# Patient Record
Sex: Female | Born: 1955 | Race: White | Hispanic: No | Marital: Married | Smoking: Former smoker
Health system: Southern US, Community
[De-identification: ages and names within clinical notes are randomized; demographics above are authoritative.]

## PROBLEM LIST (undated history)

## (undated) DIAGNOSIS — E669 Obesity, unspecified: Secondary | ICD-10-CM

## (undated) DIAGNOSIS — F419 Anxiety disorder, unspecified: Secondary | ICD-10-CM

## (undated) DIAGNOSIS — I10 Essential (primary) hypertension: Secondary | ICD-10-CM

## (undated) HISTORY — PX: ABDOMINAL HYSTERECTOMY: SHX81

## (undated) HISTORY — PX: GASTRIC BYPASS: SHX52

## (undated) HISTORY — DX: Anxiety disorder, unspecified: F41.9

## (undated) HISTORY — PX: CATARACT EXTRACTION: SUR2

## (undated) HISTORY — PX: CHOLECYSTECTOMY: SHX55

---

## 2001-06-13 ENCOUNTER — Encounter (INDEPENDENT_AMBULATORY_CARE_PROVIDER_SITE_OTHER): Payer: Self-pay | Admitting: Specialist

## 2001-06-13 ENCOUNTER — Other Ambulatory Visit: Admission: RE | Admit: 2001-06-13 | Discharge: 2001-06-13 | Payer: Self-pay | Admitting: *Deleted

## 2001-11-08 ENCOUNTER — Inpatient Hospital Stay (HOSPITAL_COMMUNITY): Admission: RE | Admit: 2001-11-08 | Discharge: 2001-11-11 | Payer: Self-pay | Admitting: *Deleted

## 2001-11-08 ENCOUNTER — Encounter (INDEPENDENT_AMBULATORY_CARE_PROVIDER_SITE_OTHER): Payer: Self-pay

## 2003-04-17 ENCOUNTER — Ambulatory Visit (HOSPITAL_COMMUNITY): Admission: RE | Admit: 2003-04-17 | Discharge: 2003-04-17 | Payer: Self-pay | Admitting: Family Medicine

## 2003-04-17 ENCOUNTER — Encounter: Payer: Self-pay | Admitting: Family Medicine

## 2007-03-25 ENCOUNTER — Ambulatory Visit (HOSPITAL_BASED_OUTPATIENT_CLINIC_OR_DEPARTMENT_OTHER): Admission: RE | Admit: 2007-03-25 | Discharge: 2007-03-25 | Payer: Self-pay | Admitting: *Deleted

## 2007-03-27 ENCOUNTER — Ambulatory Visit: Payer: Self-pay | Admitting: Internal Medicine

## 2009-04-29 ENCOUNTER — Ambulatory Visit (HOSPITAL_COMMUNITY): Admission: RE | Admit: 2009-04-29 | Discharge: 2009-04-29 | Payer: Self-pay | Admitting: Surgery

## 2009-05-16 ENCOUNTER — Encounter: Admission: RE | Admit: 2009-05-16 | Discharge: 2009-05-16 | Payer: Self-pay | Admitting: Surgery

## 2009-05-31 ENCOUNTER — Ambulatory Visit (HOSPITAL_COMMUNITY): Admission: RE | Admit: 2009-05-31 | Discharge: 2009-05-31 | Payer: Self-pay | Admitting: Surgery

## 2011-05-08 NOTE — Op Note (Signed)
[facility]  Patient:    Anne Sims, Anne Sims Visit Number: [id number] MRN: [id number]          Service Type: GYN Location: 4W 0457 02 Attending Physician:  Marin Comment Dictated by:   Lindaann Slough, M.D. Proc. Date: 11/08/01 Admit Date:  11/08/2001   CC:         Pershing Cox, M.D.   Operative Report  PREOPERATIVE DIAGNOSIS:  Stress urinary incontinence.  POSTOPERATIVE DIAGNOSIS:  Stress urinary incontinence.  PROCEDURE:  Burch procedure.  SURGEON:  Lindaann Slough, M.D.  ASSISTANT:  Pershing Cox, M.D.  ANESTHESIA:  General.  INDICATION:  The patient is a 55 year old female, who has been complaining of stress incontinence for about 10 years.  Her incontinence is worse with coughing, sneezing, and laughter.  The symptoms have been getting worse lately.  She was found on physical examination to have a cystocele.  She is scheduled for a hysterectomy and for Burch procedure at the same time.  The hysterectomy was already performed by Dr. Carey Bullocks.  DESCRIPTION OF PROCEDURE:  The retropubic space was entered.  Then with a finger in the vagina, three sutures of 0 Prolene were placed on either side of the bladder neck.  The sutures were then approximated to the Coopers ligament.  Hemostasis was achieved with electrocautery.  The Foley catheter that was previously inserted in the bladder was removed.  A flexible cystoscope was then inserted in the bladder.  The bladder mucosa was normal.  There was no evidence of penetration of the bladder with the sutures. The ureteral orifices were in normal position and shape with clear efflux. The cystoscope was removed.  A #16 Foley catheter was then reinserted in the bladder.  Then the wound was closed by Dr. Carey Bullocks.  The patient tolerated the procedure well. Dictated by:   Lindaann Slough, M.D. Attending Physician:  Marin Comment DD:  11/08/01 TD:  11/08/01 Job:  [id number]

## 2011-05-08 NOTE — Discharge Summary (Signed)
[facility]  Patient:    RALYNN, SAN Visit Number: [id number] MRN: [id number]          Service Type: GYN Location: 4W 0457 02 Attending Physician:  Marin Comment Dictated by:   Pershing Cox, M.D. Admit Date:  11/08/2001 Discharge Date: 11/11/2001   CC:         Lindaann Slough, M.D.   Discharge Summary  ADMISSION DIAGNOSES: 1. Menorrhagia. 2. Uterine myomas. 3. Stress urinary incontinence.  DISCHARGE DIAGNOSES: 1. Menorrhagia. 2. Uterine myomas. 3. Stress urinary incontinence.  CONDITION ON DISCHARGE:  Stable.  For details of the patients admission history and physical, please see the transcribed note dated 11/08/01.  Briefly, this patient is a 55 year old woman who was found to have a submucosal myoma on evaluation of menorrhagia.  She also had significant stress urinary incontinence.  A repair procedure was planned, and the decision was made to proceed with hysterectomy in conjunction with this repair procedure.  HOSPITAL COURSE:  The patient was taken to the operating room on the day of admission, 11/08/01.  Under general anesthesia, exploratory laparotomy, total abdominal hysterectomy, and bilateral salpingo-oophorectomy were performed. Following this procedure, Dr. Brunilda Payor performed a Charletta Cousin procedure.  There were no intraoperative complications.  There was a total of 700 cc blood loss combining both procedures.  On the evening of postoperative day #1, the patient was without complaints except for pain from her Foley catheter.  She had an epidural placed for postoperative pain relief and was tolerating her pain well.  She was seen on postoperative day #1, and was not having any significant problems, and her epidural anesthesia was discontinued.  Dr. Brunilda Payor discontinued her Foley catheter on the morning of postoperative day #1.  On the morning of postoperative day #2, she was having no problems.  She was seen by Dr.  Brunilda Payor and felt that she was voiding without difficulty, and continued plans were made to follow her voiding.  On the morning of postoperative day #3, her pain was well controlled, and she was able to be discharged to home with prescriptions for Motrin and Tylox for pain.  She will be followed in Dr. Lemont Fillers office and in my office for postoperative care. Dictated by:   Pershing Cox, M.D. Attending Physician:  Marin Comment DD:  12/02/01 TD:  12/02/01 Job: [id number]

## 2011-05-08 NOTE — Procedures (Signed)
[name], Anne Sims              ACCOUNT NO.:  [id number]   MEDICAL RECORD NO.:  [id number]          PATIENT TYPE:  OUT   LOCATION:  SLEEP CENTER                 FACILITY:  [facility]   PHYSICIAN:  Clinton D. Maple Hudson, MD, FCCP, FACPDATE OF BIRTH:  07-25-55   DATE OF STUDY:  03/21/2007                            NOCTURNAL POLYSOMNOGRAM   REFERRING PHYSICIAN:   REFERRING PHYSICIAN:  Robert P. Merla Riches, M.D.   INDICATIONS FOR STUDY:  Insomnia with sleep apnea.  Epworth sleepiness  score 7/24, BMI 41.7, weight 213 pounds.  Home medications were listed  and reviewed.   SLEEP ARCHITECTURE:  Total sleep time 357 minutes with sleep efficiency  79%.  Stage I was 3%, stage II 72%, stages III and IV 2%, REM 23% of  total sleep time.  Sleep latency 23 minutes, REM latency 303 minutes.  Awake after sleep onset 70 minutes, arousal index 6.9.  No bedtime  medication was taken.   RESPIRATORY DATA:  Split study protocol.  Apnea/hypopnea index (AHI,  RDI) 54.6 obstructive events per hour, indicating severe obstructive  sleep apnea/hypopnea syndrome before CPAP.  There were 58 obstructive  apneas and 98 hypopneas before CPAP.  Events were not positional.  REM  AHI 9.6 per hour.  CPAP was titrated to 21 CWP to control snoring, AHI 0  per hour.  Complete obstructive event control had been obtained at 18  CWP, AHI 0 per hour.  A small ResMed Quattro mask was used with heated  humidifier.   OXYGEN DATA:  Moderate to loud snoring with oxygen desaturation to a  nadir of 87% before CPAP.  After CPAP control saturation held 95% on  room air.   CARDIAC DATA:  Normal sinus rhythm.   MOVEMENT/PARASOMNIA:  Occasional limb jerk, insignificant.   IMPRESSION/RECOMMENDATION:  1. Severe obstructive sleep apnea/hypopnea syndrome, AHI 54.6 per hour      with nonpositional events, moderate to loud snoring, and oxygen      desaturation to a nadir of 87%.  2. CPAP of 18 CWP was adequate to control obstructive  events.      Pressure of 21 CWP stopped all snoring but      would require a bilevel machine and the additional pressure may be      less well tolerated.  Suggest initial home trial at 18 CWP.  A      small ResMed Quattro mask was used with heated humidifier.      Clinton D. Maple Hudson, MD, [facility], FACP  Diplomate, Biomedical engineer of Sleep Medicine  Electronically Signed     CDY/MEDQ  D:  03/27/2007 12:58:12  T:  03/27/2007 23:06:00  Job:  [id number]

## 2011-05-08 NOTE — Op Note (Signed)
[facility]  Patient:    Anne Sims, JUMP Visit Number: [id number] MRN: [id number]          Service Type: Attending:  Pershing Cox, M.D. Dictated by:   Pershing Cox, M.D. Proc. Date: 11/08/01   CC:         Sung Amabile. Roslyn Smiling, M.D.  Lindaann Slough, M.D.   Operative Report  PREOPERATIVE DIAGNOSES: 1. Menorrhagia. 2. Myomatous uterus. 3. Morbid obesity.  POSTOPERATIVE DIAGNOSES: 1. Menorrhagia. 2. Myomatous uterus. 3. Morbid obesity.  PROCEDURE:  Exploratory laparotomy, total abdominal hysterectomy, bilateral salpingo-oophorectomy.  SURGEON:  Pershing Cox, M.D.  ASSISTANT SURGEON:  Sung Amabile. Roslyn Smiling, M.D.  ESTIMATED BLOOD LOSS:  200 cc (700 cc total when combined with the Burch procedure).  FLUIDS:  2800 crystalloid, 1000 of Hespan.  URINE OUTPUT:  275 cc.  INDICATIONS FOR PROCEDURE:  Anne Sims is a 55 year old female, who has been incapacitated by heavy vaginal bleeding.  She has no submucosal myomas but had a strong desire to move ahead with hysterectomy because of her bleeding.  For this reason, she is brought to operating room today for removal of her uterus.  She also has significant stress incontinence, and the repair of her bladder was accompanied by the hysterectomy to increase the chances of good response from her bladder repair.  See the notes from Dr. Brunilda Payor regarding this.  OPERATIVE FINDINGS:  The patients uterus is 14 weeks in size.  There were large fibroids, especially in the lower uterine segment and on the right, distorting the uterine blood supply.  Ovaries were normal in appearance. There was adhesive disease of the omentum to the lower abdomen and pelvis, especially attached to the left adnexa.  Otherwise, there were no significant findings.  There were adhesions of the omentum to the anterior abdominal wall over the side of the patients previous cholecystectomy scar.  DESCRIPTION OF PROCEDURE:   [name] was brought to the operating room with an IV in place.  She received 1 g of Ancef in the holding area.  Supine on the OR table, general endotracheal anesthesia was administered without difficulty.  She was placed into Allen stirrups where she remained for the remainder of the procedure.  Hibiclens was used to prep the anterior abdominal wall, upper thighs, perineum, and vagina.  A Foley catheter was sterilely inserted into the bladder.  The lower abdomen was marked appropriately for the patients incision.  Marcaine 0.25% was injected beneath the proposed incision line.  Scalpel was used to make the incision, carried down through the subcutaneous tissues.  Fascia was scored and opened with cautery and then curved Mayo scissors.  The fascia was separated from the underlying rectus muscles.  The rectus muscles were separated, and we entered the peritoneum atraumatically.  Exploratory laparotomy was performed.  Balfour retractor was used to retract the skin edges with the skin edges protected with wet drapes. Bladder blade was placed.  A long Kelly clamp was used to raise the uterus from the pelvis.  We were able to locate the round ligament on the left side which was suture ligated and then transected with cautery.  Because of the adhesive disease and the huge size of the uterus, we decided to leave the ovaries in situ until the hysterectomy had been completed.  A space in the broad ligament was pierced, and the uteroovarian ligament and fallopian tube were clamped, and the pedicle was cut and free-tie ligated.  Long Kelly clamp was kept on the uterine  adnexal attachment.  Next, the round ligament on the patients right was identified, suture ligated, and transected with cautery.  The right fallopian tube and ovary were separated from the uterus in a manner identical to the left.  The peritoneum overlying the lower uterine segment was incised, and the bladder was taken down by  sharp and blunt dissection off the lower uterine segment and upper cervix.  Starting on the patients left side, the uterine arteries were identified.  Because of the size of the uterus, it was necessary to place a straight clamp prior to getting to the uterine arteries.  This was suture ligated.  Then the Heaney clamp was used to clamp the uterine artery which was suture ligated.  A second throw was placed around the uterine artery so that it was doubly ligated.  A second straight clamp was placed on this side and Heaney ligated.  On the patients right side, a lower uterine segment fibroid distorted the uterine anatomy such that we had to dissect around this fibroid, placing straight clamps and sutures until we reached the bottom of the fibroid.  Uterine arteries on this side were secured with clamp, cut, suture, and a double pedicle like the left side.  Once we had taken both uterine arteries successfully, a malleable was placed behind the uterus, and a skin knife was used to incise the uterine fundus from the remaining cervix. With the fundus out of the way, we were then able to use straight clamps and come down the sides of the cervix with Heaney ligatures at each site.  We were able to discern uterosacral ligaments only by palpation.  These were clamped and Heaney ligated.  The cervix was then removed from the top of the vagina after Heaney clamps had been placed on either side.  Angled sutures were created and then tied to the uterosacral ligaments.  The sutures securing the vagina were run along the front and back of the cuff, and the cuff was then closed.  We inspected the pelvis for bleeding, and there was no evidence of active bleeding.  We then went above and removed the ovaries.  The peritoneum along the posterior broad ligament was incised so that the IP ligament could be completely visualized.  The ureters were below these pedicles and were at no  time in any danger.  IP  ligaments were clamped, cut, suture and free-tie ligated.  The peritoneal cavity was irrigated.  There was an adhesion of the omentum to the posterior cul-de-sac, and this was freed by sharp dissection.  The peritoneum was then closed with a running stitch of Vicryl.  Dr. Brunilda Payor then came into the operating room to perform the Burch procedure.  Please see his operative note for details of this repair.  Following Dr. Lemont Fillers completion of the procedure, the fascia was closed with 0 Monocryl running from side-to-side and tying in the middle.  The subcutaneous tissues were irrigated, and there was no evidence of active bleeding.  Skin staples were applied. Dictated by:   Pershing Cox, M.D. Attending:  Pershing Cox, M.D. DD:  11/08/01 TD:  11/08/01 Job: [id number]

## 2012-07-18 ENCOUNTER — Other Ambulatory Visit: Payer: Self-pay | Admitting: Family Medicine

## 2012-07-18 NOTE — Telephone Encounter (Signed)
Need chart

## 2012-07-19 NOTE — Telephone Encounter (Signed)
Chart pulled °

## 2012-08-16 ENCOUNTER — Ambulatory Visit: Payer: Self-pay | Admitting: Physician Assistant

## 2012-08-16 ENCOUNTER — Encounter: Payer: Self-pay | Admitting: Physician Assistant

## 2012-08-16 VITALS — BP 124/76 | HR 78 | Temp 98.1°F | Resp 16 | Ht 60.0 in | Wt 186.0 lb

## 2012-08-16 DIAGNOSIS — F419 Anxiety disorder, unspecified: Secondary | ICD-10-CM | POA: Insufficient documentation

## 2012-08-16 DIAGNOSIS — Z9884 Bariatric surgery status: Secondary | ICD-10-CM

## 2012-08-16 DIAGNOSIS — F411 Generalized anxiety disorder: Secondary | ICD-10-CM

## 2012-08-16 MED ORDER — CITALOPRAM HYDROBROMIDE 40 MG PO TABS: 40.0000 mg | ORAL_TABLET | Freq: Every day | ORAL | Status: DC

## 2012-08-16 NOTE — Progress Notes (Signed)
  Subjective:    Patient ID: Anne Sims, female    DOB: June 05, 1956, 56 y.o.   MRN: [id number]  HPI  Pt presents to clinic for medication refill.  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  She has noticed that she gets upset easier than normal.  She does know that stress induces her to increase eating which has added to her weight gain.  She has been on Wellbutrin in the past with good success, her sister is on buspar and she wonders if either of these might be good options for her.  Review of Systems  Psychiatric/Behavioral: The patient is nervous/anxious.        Objective:   Physical Exam  Vitals reviewed. Constitutional: She is oriented to person, place, and time. She appears well-developed and well-nourished.  HENT:  Head: Normocephalic and atraumatic.  Right Ear: External ear normal.  Left Ear: External ear normal.  Eyes: Conjunctivae are normal.  Neck: Normal range of motion.  Cardiovascular: Normal rate, regular rhythm and normal heart sounds.  Exam reveals no gallop and no friction rub.   No murmur heard. Pulmonary/Chest: Effort normal and breath sounds normal.  Neurological: She is alert and oriented to person, place, and time.  Skin: Skin is warm and dry.  Psychiatric: She has a normal mood and affect. Her behavior is normal. Judgment and thought content normal.        Assessment & Plan:   1. Anxiety  citalopram (CELEXA) 40 MG tablet  2. S/P gastric bypass     D/w pt the options - I believe an increase in her Celexa would be her best option.  She has no insurance and this will keep her payment the same and she knows she tolerates this medication well.  Pt will use this medication over the next 6-8 wks and if tolerating well will call  and we can call in 3 RF at that time.  If patient does not have improved symptoms she will RTC and we will add another medication at that time.

## 2012-09-21 ENCOUNTER — Ambulatory Visit: Payer: Self-pay | Admitting: Emergency Medicine

## 2012-09-21 VITALS — BP 125/76 | HR 80 | Temp 98.4°F | Resp 20 | Ht 60.5 in | Wt 184.0 lb

## 2012-09-21 DIAGNOSIS — L732 Hidradenitis suppurativa: Secondary | ICD-10-CM | POA: Insufficient documentation

## 2012-09-21 DIAGNOSIS — L02214 Cutaneous abscess of groin: Secondary | ICD-10-CM

## 2012-09-21 DIAGNOSIS — L03319 Cellulitis of trunk, unspecified: Secondary | ICD-10-CM

## 2012-09-21 MED ORDER — DOXYCYCLINE HYCLATE 100 MG PO CAPS: 100.0000 mg | ORAL_CAPSULE | Freq: Two times a day (BID) | ORAL | Status: DC

## 2012-09-21 NOTE — Progress Notes (Signed)
   Patient ID: JAYLENA HOLLOWAY MRN: [id number], DOB: 02/02/1956, 56 y.o. Date of Encounter: 09/21/2012, 4:19 PM    PROCEDURE NOTE: Verbal consent obtained. Betadine prep per usual protocol. Local anesthesia obtained with 1% plain lidocaine 4 cc.  T incision made with 11 blade along lesion to enlarge already draining two pores.  Culture taken already taken by Dr. Cleta Alberts. Moderate amount of purulence expressed. Dressed. Wound care instructions including precautions with patient. Patient tolerated the procedure well. Patient to perform warm compresses this evening. Recheck in 24 hours, to the ER if worsening.      Signed, Eula Listen, PA-C 09/21/2012 4:19 PM

## 2012-09-21 NOTE — Progress Notes (Signed)
  Subjective:    Patient ID: Anne Sims, female    DOB: 1956/01/01, 56 y.o.   MRN: [id number]  HPI Right inner thigh infection. Present for the last week getting worse with increasing purulent drainage without odor   Double anti-depressant increasing mood swings. Medication is now stabilized and moods are back to the way they were. The patient has a history of recurrent infections. She has a history of hidradenitis and has had recurrent disease.  Review of Systems     Objective:   Physical Exam She has signs of hidradenitis in the left groin. In the right groin she has evidence of previous I&D site as well as fistulous tracts. There is an open drainage site with purulent drainage coming from the fistulous tract with pressure superior to the fistulous tract material is expressed. Culture was obtained.       Assessment & Plan:  Go ahead and I&D the abscess site with doxycycline twice a day for I have asked Ryan to do an I&D with packing.

## 2012-09-22 ENCOUNTER — Ambulatory Visit (INDEPENDENT_AMBULATORY_CARE_PROVIDER_SITE_OTHER): Payer: Self-pay | Admitting: Physician Assistant

## 2012-09-22 VITALS — BP 126/76 | HR 67 | Temp 98.4°F | Resp 16 | Ht 60.5 in | Wt 184.0 lb

## 2012-09-22 DIAGNOSIS — L02214 Cutaneous abscess of groin: Secondary | ICD-10-CM

## 2012-09-22 DIAGNOSIS — L732 Hidradenitis suppurativa: Secondary | ICD-10-CM

## 2012-09-22 DIAGNOSIS — L03319 Cellulitis of trunk, unspecified: Secondary | ICD-10-CM

## 2012-09-22 NOTE — Progress Notes (Signed)
   Patient ID: ADDELYNN BAYES MRN: [id number], DOB: [birth year] 56 y.o. Date of Encounter: 09/22/2012, 11:14 AM  Primary Physician: Tally Due, MD  Chief Complaint: Wound care   See previous note  HPI: 56 y.o. y/o female presents for wound care s/p I&D on 09/21/12 Doing well No issues or complaints Feeling much better Afebrile/ no chills No nausea or vomiting Tolerating Doxycycline Pain much improved Daily dressing change Several hot compresses the previous evening Previous note reviewed  No past medical history on file.   Home Meds: Prior to Admission medications   Medication Sig Start Date End Date Taking? Authorizing Provider  citalopram (CELEXA) 40 MG tablet Take 1 tablet (40 mg total) by mouth daily. 08/16/12  Yes Morrell Riddle, PA-C  doxycycline (VIBRAMYCIN) 100 MG capsule Take 1 capsule (100 mg total) by mouth 2 (two) times daily. 09/21/12  Yes Collene Gobble, MD    Allergies: No Known Allergies  ROS: Constitutional: Afebrile, no chills Cardiovascular: negative for chest pain or palpitations Dermatological: Positive for wound. GI: No nausea or vomiting   EXAM: Physical Exam: Blood pressure 126/76, pulse 67, temperature 98.4 F (36.9 C), temperature source Oral, resp. rate 16, height 5' 0.5" (1.537 m), weight 184 lb (83.462 kg), last menstrual period 01/16/2001, SpO2 96.00%., Body mass index is 35.34 kg/(m^2). General: Well developed, well nourished, in no acute distress. Nontoxic appearing. Head: Normocephalic, atraumatic, sclera non-icteric.  Neck: Supple. Lungs: Breathing is unlabored. Heart: Normal rate. Skin:  Warm and moist. Dressing and packing in place. Improved induration and erythema. Minimal tenderness to palpation. Neuro: Alert and oriented X 3. Moves all extremities spontaneously. Normal gait.  Psych:  Responds to questions appropriately with a normal affect.   PROCEDURE: Dressing removed. Small amount of purulence expressed Wound bed  healthy Irrigated with 1% plain lidocaine 10 cc. Packed with 1/4 inch plain packing Dressing applied  LAB: Culture: pending  A/P: 56 y.o. y/o female with improving cellulitis/abscess as above s/p I&D on 09/21/12 -Wound care per above -Continue Doxycycline -Pain well controlled -Daily dressing changes -Recheck 24 hours  Signed, Eula Listen, PA-C 09/22/2012 11:14 AM

## 2012-09-23 ENCOUNTER — Ambulatory Visit (INDEPENDENT_AMBULATORY_CARE_PROVIDER_SITE_OTHER): Payer: Self-pay | Admitting: Physician Assistant

## 2012-09-23 VITALS — BP 126/76 | HR 74 | Temp 98.4°F | Resp 18 | Ht 60.5 in | Wt 184.0 lb

## 2012-09-23 DIAGNOSIS — L02419 Cutaneous abscess of limb, unspecified: Secondary | ICD-10-CM

## 2012-09-23 DIAGNOSIS — L732 Hidradenitis suppurativa: Secondary | ICD-10-CM

## 2012-09-23 NOTE — Progress Notes (Signed)
   Patient ID: Anne Sims MRN: [id number], DOB: Nov 19, 1956 56 y.o. Date of Encounter: 09/23/2012, 11:26 AM  Primary Physician: Tally Due, MD  Chief Complaint: Wound care   See previous note  HPI: 56 y.o. y/o female presents for wound care s/p I&D on 09/21/2012. Doing well No issues or complaints Afebrile/ no chills No nausea or vomiting Tolerating Doxycycline Pain mostly resolved Daily dressing change Previous note reviewed  No past medical history on file.   Home Meds: Prior to Admission medications   Medication Sig Start Date End Date Taking? Authorizing Provider  citalopram (CELEXA) 40 MG tablet Take 1 tablet (40 mg total) by mouth daily. 08/16/12  Yes Morrell Riddle, PA-C  doxycycline (VIBRAMYCIN) 100 MG capsule Take 1 capsule (100 mg total) by mouth 2 (two) times daily. 09/21/12  Yes Collene Gobble, MD    Allergies: No Known Allergies  ROS: Constitutional: Afebrile, no chills Cardiovascular: negative for chest pain or palpitations Dermatological: Positive for wound.  GI: No nausea or vomiting   EXAM: Physical Exam: Blood pressure 126/76, pulse 74, temperature 98.4 F (36.9 C), temperature source Oral, resp. rate 18, height 5' 0.5" (1.537 m), weight 184 lb (83.462 kg), last menstrual period 01/16/2001, SpO2 97.00%., Body mass index is 35.34 kg/(m^2). General: Well developed, well nourished, in no acute distress. Nontoxic appearing. Head: Normocephalic, atraumatic, sclera non-icteric.  Neck: Supple. Lungs: Breathing is unlabored. Heart: Normal rate. Skin:  Warm and moist. Dressing and packing in place. Much improved induration, erythema, or tenderness to palpation. Neuro: Alert and oriented X 3. Moves all extremities spontaneously. Normal gait.  Psych:  Responds to questions appropriately with a normal affect.   PROCEDURE: Dressing and packing removed. Minimal purulence expressed Wound bed healthy Irrigated with 1% plain lidocaine 5 cc. Repacked  with 1/4 inch plain packing Dressing applied  LAB: Culture: Preliminary No organisms seen  A/P: 56 y.o. y/o female with improving cellulitis/abscess as above s/p I&D on 09/21/2012 -Wound care per above -Continue Doxycyline -Pain well controlled -Daily dressing changes -Recheck 48-72 hours  Signed, Eula Listen, PA-C 09/23/2012 11:26 AM

## 2012-09-24 ENCOUNTER — Telehealth: Payer: Self-pay

## 2012-09-24 DIAGNOSIS — F419 Anxiety disorder, unspecified: Secondary | ICD-10-CM

## 2012-09-24 LAB — WOUND CULTURE: Gram Stain: NONE SEEN

## 2012-09-24 MED ORDER — CITALOPRAM HYDROBROMIDE 40 MG PO TABS: 40.0000 mg | ORAL_TABLET | Freq: Every day | ORAL | Status: DC

## 2012-09-24 MED ORDER — CIPROFLOXACIN HCL 500 MG PO TABS: 500.0000 mg | ORAL_TABLET | Freq: Two times a day (BID) | ORAL | Status: DC

## 2012-09-24 NOTE — Addendum Note (Signed)
Addended by: Johnnette Litter on: 09/24/2012 03:38 PM   Modules accepted: Orders

## 2012-09-24 NOTE — Telephone Encounter (Signed)
LMOM RX sent to pharmacy 

## 2012-09-24 NOTE — Telephone Encounter (Signed)
Pt states she is doing really well on Celexa 40. Says she knows she not going to run out until the end of Nov but wants to go ahead and get an ok from [place] to refill the med for the rest of the yr. Likes the 90d supply. [facility] on [address]

## 2012-09-24 NOTE — Telephone Encounter (Signed)
Rx sent to pharmacy   

## 2012-09-26 ENCOUNTER — Ambulatory Visit (INDEPENDENT_AMBULATORY_CARE_PROVIDER_SITE_OTHER): Payer: Self-pay | Admitting: Physician Assistant

## 2012-09-26 VITALS — BP 128/70 | HR 70 | Temp 98.1°F | Resp 16 | Ht 60.5 in | Wt 188.0 lb

## 2012-09-26 DIAGNOSIS — L03119 Cellulitis of unspecified part of limb: Secondary | ICD-10-CM

## 2012-09-26 DIAGNOSIS — L732 Hidradenitis suppurativa: Secondary | ICD-10-CM

## 2012-09-26 DIAGNOSIS — L02419 Cutaneous abscess of limb, unspecified: Secondary | ICD-10-CM

## 2012-09-26 NOTE — Progress Notes (Signed)
   Patient ID: Anne Sims MRN: [id number], DOB: [birth year] 56 y.o. Date of Encounter: 09/26/2012, 10:33 AM  Primary Physician: Tally Due, MD  Chief Complaint: Wound care   See previous note  HPI: 56 y.o. y/o female presents for wound care s/p I&D on 09/21/2012. Doing well No issues or complaints Afebrile/ no chills No nausea or vomiting Tolerating Ciprofloxacin  Pain improving Daily dressing change Culture came back as Doxycyline resistant Staph aureus, patient was changed to cipro on 09/25/12. She has taken three doses of her cipro at the time of this office visit. Previous note reviewed  No past medical history on file.   Home Meds: Prior to Admission medications   Medication Sig Start Date End Date Taking? Authorizing Provider  ciprofloxacin (CIPRO) 500 MG tablet Take 1 tablet (500 mg total) by mouth 2 (two) times daily. 09/24/12  Yes Collene Gobble, MD  citalopram (CELEXA) 40 MG tablet Take 1 tablet (40 mg total) by mouth daily. 09/24/12  Yes Heather M Marte, PA-C  doxycycline (VIBRAMYCIN) 100 MG capsule Take 1 capsule (100 mg total) by mouth 2 (two) times daily. 09/21/12  No Collene Gobble, MD    Allergies: No Known Allergies  ROS: Constitutional: Afebrile, no chills Cardiovascular: negative for chest pain or palpitations Dermatological: Positive for wound.  GI: No nausea or vomiting   EXAM: Physical Exam: Blood pressure 128/70, pulse 70, temperature 98.1 F (36.7 C), temperature source Oral, resp. rate 16, height 5' 0.5" (1.537 m), weight 188 lb (85.276 kg), last menstrual period 01/16/2001, SpO2 98.00%., Body mass index is 36.11 kg/(m^2). General: Well developed, well nourished, in no acute distress. Nontoxic appearing. Head: Normocephalic, atraumatic, sclera non-icteric.  Neck: Supple. Lungs: Breathing is unlabored. Heart: Normal rate. Skin:  Warm and moist. Dressing and packing in place. Mild induration with small amount of mild erythema spreading  distally, and tenderness to palpation inferiorly. Neuro: Alert and oriented X 3. Moves all extremities spontaneously. Normal gait.  Psych:  Responds to questions appropriately with a normal affect.   PROCEDURE: Dressing and packing removed. Small amount of purulence expressed from proximal aspect of lesion and inferior aspect of lesion. Wound bed healthy Irrigated with 1% plain lidocaine 5 cc. Repacked with 1/4 inch plain packing Dressing applied  LAB: Culture: Staph, resistant to Doxycycline  A/P: 56 y.o. y/o female with cellulitis/abscess as above s/p I&D on 09/21/2012. -Wound care per above -Continue Ciprofloxacin -Pain well controlled -Daily dressing changes -Recheck 48 hours, sooner if worse  Signed, Eula Listen, PA-C 09/26/2012 10:33 AM

## 2012-09-28 ENCOUNTER — Ambulatory Visit (INDEPENDENT_AMBULATORY_CARE_PROVIDER_SITE_OTHER): Payer: Self-pay | Admitting: Physician Assistant

## 2012-09-28 VITALS — BP 122/76 | HR 67 | Temp 97.7°F | Resp 16 | Ht 61.0 in | Wt 188.0 lb

## 2012-09-28 DIAGNOSIS — L02419 Cutaneous abscess of limb, unspecified: Secondary | ICD-10-CM

## 2012-09-28 DIAGNOSIS — L732 Hidradenitis suppurativa: Secondary | ICD-10-CM

## 2012-09-28 NOTE — Progress Notes (Signed)
   Patient ID: Anne Sims MRN: [id number], DOB: January 09, 1956 56 y.o. Date of Encounter: 09/28/2012, 10:04 AM  Primary Physician: Tally Due, MD  Chief Complaint: Wound care   See previous note  HPI: 56 y.o. y/o female presents for wound care s/p I&D on 09/21/2012. Doing well No issues or complaints Afebrile/ no chills No nausea or vomiting Tolerating Cipro well Pain resolved "I wanted to have sex this morning." Daily dressing change Erythema resolved Previous note reviewed  No past medical history on file.   Home Meds: Prior to Admission medications   Medication Sig Start Date End Date Taking? Authorizing Provider  ciprofloxacin (CIPRO) 500 MG tablet Take 1 tablet (500 mg total) by mouth 2 (two) times daily. 09/24/12  Yes Collene Gobble, MD  citalopram (CELEXA) 40 MG tablet Take 1 tablet (40 mg total) by mouth daily. 09/24/12  Yes Heather Jaquita Rector, PA-C    Allergies: No Known Allergies  ROS: Constitutional: Afebrile, no chills Cardiovascular: negative for chest pain or palpitations Dermatological: Positive for wound. Negative for erythema, pain, or warmth  GI: No nausea or vomiting   EXAM: Physical Exam: Blood pressure 122/76, pulse 67, temperature 97.7 F (36.5 C), temperature source Oral, resp. rate 16, height 5\' 1"  (1.549 m), weight 188 lb (85.276 kg), last menstrual period 01/16/2001, SpO2 98.00%., Body mass index is 35.52 kg/(m^2). General: Well developed, well nourished, in no acute distress. Nontoxic appearing. Head: Normocephalic, atraumatic, sclera non-icteric.  Neck: Supple. Lungs: Breathing is unlabored. Heart: Normal rate. Skin:  Warm and moist. Dressing and packing in place. No induration. Minimal erythema inferiorly, this is much improved from prior office visit. Slight deep tenderness to palpation although this too is much improved. Neuro: Alert and oriented X 3. Moves all extremities spontaneously. Normal gait.  Psych:  Responds to questions  appropriately with a normal affect.   PROCEDURE: Dressing and packing removed. Scant purulence expressed Wound bed healthy Irrigated with 1% plain lidocaine 5 cc. Repacked with 1/4 inch plain packing Dressing applied  LAB: Culture: Staph aureus resistant to Doxycycline, on Cipro  A/P: 56 y.o. y/o female with improving cellulitis/abscess as above s/p I&D on 09/21/2012.  -Wound care per above -Continue Cipro -Pain resolved -Daily dressing changes -Recheck 48 hours  Signed, Eula Listen, PA-C 09/28/2012 10:04 AM

## 2012-09-30 ENCOUNTER — Ambulatory Visit (INDEPENDENT_AMBULATORY_CARE_PROVIDER_SITE_OTHER): Payer: Self-pay | Admitting: Physician Assistant

## 2012-09-30 VITALS — BP 138/70 | HR 74 | Temp 98.1°F | Resp 18 | Ht 60.5 in | Wt 187.0 lb

## 2012-09-30 DIAGNOSIS — L02419 Cutaneous abscess of limb, unspecified: Secondary | ICD-10-CM

## 2012-09-30 DIAGNOSIS — L03119 Cellulitis of unspecified part of limb: Secondary | ICD-10-CM

## 2012-09-30 NOTE — Progress Notes (Signed)
Patient ID: ZOI DEVINE MRN: [id number], DOB: [birth year] 56 y.o. Date of Encounter: 09/30/2012, 11:23 AM  Chief Complaint: Wound care   See previous note  HPI: 56 y.o. y/o female presents for wound care s/p I&D on 09/21/12 Doing well No issues or complaints Afebrile/ no chills No nausea or vomiting Tolerating Cipro Pain stable. Daily dressing change Previous note reviewed  No past medical history on file.   Home Meds: Prior to Admission medications   Medication Sig Start Date End Date Taking? Authorizing Provider  ciprofloxacin (CIPRO) 500 MG tablet Take 1 tablet (500 mg total) by mouth 2 (two) times daily. 09/24/12  Yes Collene Gobble, MD  citalopram (CELEXA) 40 MG tablet Take 1 tablet (40 mg total) by mouth daily. 09/24/12  Yes Fareed Fung Jaquita Rector, PA-C    Allergies: No Known Allergies  ROS: Constitutional: Afebrile, no chills Cardiovascular: negative for chest pain or palpitations Dermatological: Positive for wound. Negative for erythema, pain, or warmth.  GI: No nausea or vomiting   EXAM: Physical Exam: Blood pressure 138/70, pulse 74, temperature 98.1 F (36.7 C), resp. rate 18, height 5' 0.5" (1.537 m), weight 187 lb (84.823 kg), last menstrual period 01/16/2001, SpO2 97.00%., Body mass index is 35.92 kg/(m^2). General: Well developed, well nourished, in no acute distress. Nontoxic appearing. Head: Normocephalic, atraumatic, sclera non-icteric.  Neck: Supple. Lungs: Breathing is unlabored. Heart: Normal rate. Skin:  Warm and moist. Dressing and packing in place. No induration, erythema, or tenderness to palpation. Neuro: Alert and oriented X 3. Moves all extremities spontaneously. Normal gait.  Psych:  Responds to questions appropriately with a normal affect.       PROCEDURE: Dressing and packing removed. No purulence expressed Wound bed healthy Irrigated with 1% plain lidocaine 5 cc. Repacked with small amount of 1/4" plain packing.  Dressing  applied  LAB: Culture:   A/P: 56 y.o. y/o female with groin cellulitis/abscess as above s/p I&D on Wound care per above Continue Cipro Pain well controlled Daily dressing changes Recheck 48 hours if needed.   Grier Mitts, PA-C 09/30/2012 11:23 AM

## 2012-11-08 ENCOUNTER — Telehealth: Payer: Self-pay

## 2012-11-08 DIAGNOSIS — F419 Anxiety disorder, unspecified: Secondary | ICD-10-CM

## 2012-11-08 NOTE — Telephone Encounter (Signed)
Pt would like to talk with Anne Sims and she would not go into detail

## 2012-11-09 NOTE — Telephone Encounter (Signed)
Can you try and get some information for me.

## 2012-11-10 MED ORDER — CITALOPRAM HYDROBROMIDE 40 MG PO TABS: 40.0000 mg | ORAL_TABLET | Freq: Every day | ORAL | Status: DC

## 2012-11-10 NOTE — Telephone Encounter (Signed)
Prescription sent to pharmacy.

## 2012-11-10 NOTE — Telephone Encounter (Signed)
Called patient, she has seen Maralyn Sago for Celexa. She states she is doing great on this medication, she needs renewal sent in to pharmacy. Have pended the Rx she is grateful to you for helping her she states she is "great"

## 2012-11-10 NOTE — Telephone Encounter (Signed)
Notified pt we have sent in Rx. Pt requests that a year of RFs be put on the Rx since she is doing so well and Maralyn Sago and Dr L have been good about getting her RFs for a year since she has no ins. Maralyn Sago, do you want to authorize 3 RFs on the 90 day Rx that was sent?

## 2012-11-11 MED ORDER — CITALOPRAM HYDROBROMIDE 40 MG PO TABS: 40.0000 mg | ORAL_TABLET | Freq: Every day | ORAL | Status: DC

## 2012-11-11 NOTE — Telephone Encounter (Signed)
I can refill until August and then I would need to see her to recheck unless something changes before then.

## 2012-11-11 NOTE — Telephone Encounter (Signed)
Notified pt that RFs sent in through Aug and then she needs OV. Pt thanked [place] and agreed.

## 2013-11-07 ENCOUNTER — Ambulatory Visit: Payer: Self-pay | Admitting: Physician Assistant

## 2013-11-07 VITALS — BP 120/80 | HR 60 | Temp 97.8°F | Resp 16 | Ht 60.0 in | Wt 194.0 lb

## 2013-11-07 DIAGNOSIS — E669 Obesity, unspecified: Secondary | ICD-10-CM

## 2013-11-07 DIAGNOSIS — F411 Generalized anxiety disorder: Secondary | ICD-10-CM

## 2013-11-07 DIAGNOSIS — F419 Anxiety disorder, unspecified: Secondary | ICD-10-CM

## 2013-11-07 MED ORDER — CITALOPRAM HYDROBROMIDE 40 MG PO TABS
40.0000 mg | ORAL_TABLET | Freq: Every day | ORAL | Status: DC
Start: 2013-11-07 — End: 2014-07-05

## 2013-11-07 NOTE — Progress Notes (Signed)
    Subjective:    Patient ID: Anne Sims, female    DOB: 09-21-1956, 56 y.o.   MRN: [id number]  HPI  Pt presents to clinic for med refill.  She feels like her Celexa is working good for her anxiety.  She recently has been under an increase amount of stress and has been a little more edgy than normal but she feels that it will pass and is happy with the Celexa results.  She is aware of her slight increase in weight. She is not sleeping well at night and she will eat when she wakes up at night and is sometimes not aware that she does it - some mornings her husbands lets her know she ate a bowl of ice cream in the middle of the night but she has no memory of it.  She is hoping to get insurance in the spring and at that time will get a referral for repeat sleep study because she has a h/o sleep apnea and used a CPAP prior to her gastric sleeve.  Review of Systems     Objective:   Physical Exam  Vitals reviewed. Constitutional: She is oriented to person, place, and time. She appears well-developed and well-nourished.  HENT:  Head: Normocephalic and atraumatic.  Right Ear: External ear normal.  Left Ear: External ear normal.  Cardiovascular: Normal rate, regular rhythm and normal heart sounds.   No murmur heard. Pulmonary/Chest: Effort normal and breath sounds normal.  Neurological: She is alert and oriented to person, place, and time.  Skin: Skin is warm and dry.  Psychiatric: She has a normal mood and affect. Her behavior is normal. Judgment and thought content normal.      Assessment & Plan:  Anxiety - Plan: citalopram (CELEXA) 40 MG tablet  Continue current medication - if she feels like she is having a decrease in effectiveness over the next year RTC for recheck.  She is aware and will try to improve her eating habits so she does not regain her weight - she states she "is just lazy".  Benny Lennert PA-C 11/07/2013 12:20 PM

## 2014-07-05 ENCOUNTER — Encounter (HOSPITAL_COMMUNITY): Payer: Self-pay | Admitting: Emergency Medicine

## 2014-07-05 ENCOUNTER — Emergency Department (HOSPITAL_COMMUNITY)
Admission: EM | Admit: 2014-07-05 | Discharge: 2014-07-05 | Disposition: A | Discharge status: Home / Self Care | Payer: Self-pay | Attending: Emergency Medicine | Admitting: Emergency Medicine

## 2014-07-05 DIAGNOSIS — Y9389 Activity, other specified: Secondary | ICD-10-CM | POA: Insufficient documentation

## 2014-07-05 DIAGNOSIS — Z87891 Personal history of nicotine dependence: Secondary | ICD-10-CM | POA: Insufficient documentation

## 2014-07-05 DIAGNOSIS — IMO0002 Reserved for concepts with insufficient information to code with codable children: Secondary | ICD-10-CM | POA: Insufficient documentation

## 2014-07-05 DIAGNOSIS — S1093XA Contusion of unspecified part of neck, initial encounter: Principal | ICD-10-CM

## 2014-07-05 DIAGNOSIS — I10 Essential (primary) hypertension: Secondary | ICD-10-CM | POA: Insufficient documentation

## 2014-07-05 DIAGNOSIS — Z79899 Other long term (current) drug therapy: Secondary | ICD-10-CM | POA: Insufficient documentation

## 2014-07-05 DIAGNOSIS — Z23 Encounter for immunization: Secondary | ICD-10-CM | POA: Insufficient documentation

## 2014-07-05 DIAGNOSIS — S0083XA Contusion of other part of head, initial encounter: Principal | ICD-10-CM | POA: Insufficient documentation

## 2014-07-05 DIAGNOSIS — S0003XA Contusion of scalp, initial encounter: Secondary | ICD-10-CM | POA: Insufficient documentation

## 2014-07-05 DIAGNOSIS — W540XXA Bitten by dog, initial encounter: Secondary | ICD-10-CM | POA: Insufficient documentation

## 2014-07-05 DIAGNOSIS — Y929 Unspecified place or not applicable: Secondary | ICD-10-CM | POA: Insufficient documentation

## 2014-07-05 HISTORY — DX: Essential (primary) hypertension: I10

## 2014-07-05 MED ORDER — TETANUS-DIPHTH-ACELL PERTUSSIS 5-2.5-18.5 LF-MCG/0.5 IM SUSP
0.5000 mL | Freq: Once | INTRAMUSCULAR | Status: AC
  Administered 2014-07-05: 0.5 mL via INTRAMUSCULAR
  Filled 2014-07-05: qty 0.5

## 2014-07-05 MED ORDER — AMOXICILLIN-POT CLAVULANATE 875-125 MG PO TABS: 1.0000 | ORAL_TABLET | Freq: Two times a day (BID) | ORAL | Status: DC

## 2014-07-05 NOTE — Discharge Instructions (Signed)
Return here as needed.  Followup with your primary care Dr. keep the areas clean and dry

## 2014-07-05 NOTE — ED Notes (Addendum)
Pt. bitten by neighbor's dog at right upper cheek and left index finger this evening , skin intact / minor abrasions , mild swelling .

## 2014-07-05 NOTE — ED Provider Notes (Signed)
CSN: [id number]     Arrival date & time 07/05/14  2024 History  This chart was scribed for Charlestine Nighthristopher Haydn Cush, PA-C working with Shon Batonourtney F Horton, MD by Evon Slackerrance Branch, ED Scribe. This patient was seen in room TR10C/TR10C and the patient's care was started at 8:50 PM.    Chief Complaint  Patient presents with  . Animal Bite   Patient is a 58 y.o. female presenting with animal bite. The history is provided by the patient. No language interpreter was used.  Animal Bite  HPI Comments: Anne Sims is a 58 y.o. female who presents to the Emergency Department complaining of dog bite onset prior to arrival. She states she was trying to break the two dogs up from fighting. She states she was bit on the hand, left side of face and lip. She states the dogs vaccinations are up to date. She states that her pain is 2/10. She denies any other symptoms.  She states she is unsure of her last tetanus.   Past Medical History  Diagnosis Date  . Hypertension    Past Surgical History  Procedure Laterality Date  . Cholecystectomy    . Abdominal hysterectomy     Family History  Problem Relation Age of Onset  . Cancer Father     esophageal  . Heart disease Father   . Hypertension Father   . High Cholesterol Father   . Heart disease Mother   . Healthy Sister    History  Substance Use Topics  . Smoking status: Former Smoker -- 37 years    Quit date: 06/20/2004  . Smokeless tobacco: Not on file  . Alcohol Use: No   OB History   Grav Para Term Preterm Abortions TAB SAB Ect Mult Living                 Review of Systems  Skin: Positive for wound.  A complete 10 system review of systems was obtained and all systems are negative except as noted in the HPI and PMH.    Allergies  Review of patient's allergies indicates no known allergies.  Home Medications   Prior to Admission medications   Medication Sig Start Date End Date Taking? Authorizing Provider  citalopram (CELEXA) 40 MG tablet  Take 1 tablet (40 mg total) by mouth daily. 11/07/13   Morrell RiddleSarah L Weber, PA-C   Triage Vitals: BP 139/73  Pulse 84  Temp(Src) 98.5 F (36.9 C) (Oral)  Resp 14  Ht 5' (1.524 m)  Wt 189 lb (85.73 kg)  BMI 36.91 kg/m2  SpO2 94%  LMP 01/16/2001  Physical Exam  Nursing note and vitals reviewed. Constitutional: She is oriented to person, place, and time. She appears well-developed and well-nourished. No distress.  HENT:  Head: Normocephalic.    Eyes: Pupils are equal, round, and reactive to light.  Neck: Neck supple. No tracheal deviation present.  Cardiovascular: Normal rate.   Pulmonary/Chest: Effort normal. No respiratory distress.  Musculoskeletal: Normal range of motion.       Hands: Neurological: She is alert and oriented to person, place, and time.  Skin: Skin is warm and dry.  Psychiatric: She has a normal mood and affect. Her behavior is normal.    ED Course  Procedures (including critical care time) DIAGNOSTIC STUDIES: Oxygen Saturation is 94% on RA, adequate by my interpretation.    COORDINATION OF CARE: 9:02 PM-Discussed treatment plan which includes antibiotics with pt at bedside and pt agreed to plan.   The patient,  states, that the dog that bit her has all the shots up-to-date.  These are more superficial, swollen areas rather than deep punctures I personally performed the services described in this documentation, which was scribed in my presence. The recorded information has been reviewed and is accurate.     Carlyle Dolly, PA-C 07/05/14 2131

## 2014-07-06 NOTE — ED Provider Notes (Signed)
Medical screening examination/treatment/procedure(s) were performed by non-physician practitioner and as supervising physician I was immediately available for consultation/collaboration.   EKG Interpretation None        Courtney F Horton, MD 07/06/14 0834 

## 2015-01-17 ENCOUNTER — Ambulatory Visit (INDEPENDENT_AMBULATORY_CARE_PROVIDER_SITE_OTHER): Payer: 59

## 2015-01-17 ENCOUNTER — Ambulatory Visit (INDEPENDENT_AMBULATORY_CARE_PROVIDER_SITE_OTHER): Payer: 59 | Admitting: Family Medicine

## 2015-01-17 VITALS — BP 142/72 | HR 71 | Temp 98.6°F | Resp 16 | Ht 60.0 in | Wt 197.4 lb

## 2015-01-17 DIAGNOSIS — IMO0001 Reserved for inherently not codable concepts without codable children: Secondary | ICD-10-CM

## 2015-01-17 DIAGNOSIS — R030 Elevated blood-pressure reading, without diagnosis of hypertension: Secondary | ICD-10-CM

## 2015-01-17 DIAGNOSIS — S4991XA Unspecified injury of right shoulder and upper arm, initial encounter: Secondary | ICD-10-CM

## 2015-01-17 DIAGNOSIS — J159 Unspecified bacterial pneumonia: Secondary | ICD-10-CM

## 2015-01-17 DIAGNOSIS — R0602 Shortness of breath: Secondary | ICD-10-CM

## 2015-01-17 LAB — COMPLETE METABOLIC PANEL WITH GFR
ALK PHOS: 129 U/L — AB (ref 39–117)
ALT: 22 U/L (ref 0–35)
AST: 22 U/L (ref 0–37)
Albumin: 4.1 g/dL (ref 3.5–5.2)
BUN: 20 mg/dL (ref 6–23)
CALCIUM: 9.2 mg/dL (ref 8.4–10.5)
CO2: 28 mEq/L (ref 19–32)
CREATININE: 0.73 mg/dL (ref 0.50–1.10)
Chloride: 99 mEq/L (ref 96–112)
GFR, Est Non African American: >89 mL/min
Glucose, Bld: 89 mg/dL (ref 70–99)
Potassium: 4.5 mEq/L (ref 3.5–5.3)
SODIUM: 138 meq/L (ref 135–145)
Total Bilirubin: 1.1 mg/dL (ref 0.2–1.2)
Total Protein: 7.6 g/dL (ref 6.0–8.3)

## 2015-01-17 LAB — POCT CBC
Granulocyte percent: 77.8 %G (ref 37–80)
HCT, POC: 41.5 % (ref 37.7–47.9)
HEMOGLOBIN: 13.3 g/dL (ref 12.2–16.2)
LYMPH, POC: 2 (ref 0.6–3.4)
MCH, POC: 28.1 pg (ref 27–31.2)
MCHC: 32.1 g/dL (ref 31.8–35.4)
MCV: 87.5 fL (ref 80–97)
MID (CBC): 0.5 (ref 0–0.9)
MPV: 7.5 fL (ref 0–99.8)
PLATELET COUNT, POC: 362 10*3/uL (ref 142–424)
POC GRANULOCYTE: 8.9 — AB (ref 2–6.9)
POC LYMPH %: 17.6 % (ref 10–50)
POC MID %: 4.6 % (ref 0–12)
RBC: 4.74 M/uL (ref 4.04–5.48)
RDW, POC: 13.8 %
WBC: 11.4 10*3/uL — AB (ref 4.6–10.2)

## 2015-01-17 LAB — TSH: TSH: 1.588 u[IU]/mL (ref 0.350–4.500)

## 2015-01-17 MED ORDER — AZITHROMYCIN 500 MG PO TABS: 500.0000 mg | ORAL_TABLET | Freq: Every day | ORAL | Status: DC

## 2015-01-17 MED ORDER — GUAIFENESIN ER 1200 MG PO TB12: 1.0000 | ORAL_TABLET | Freq: Two times a day (BID) | ORAL | Status: AC

## 2015-01-17 NOTE — Progress Notes (Signed)
Subjective:    Patient ID: Anne Sims, female    DOB: 19-May-1956, 59 y.o.   MRN: [id number]  HPI Pt presents to clinic with 2 concerns 1- SOB - started yesterday and feels like she cannot get a full breath in.  She has a slight cough when she takes a deep breath. No wheezing.  Cough is dry.  She has not had recent travel or leg pain or swelling.  She has no clotting disorder nor family h/o clotting disorders.  She had a cold about 1.5 weeks ago that caused her to stay home from work and that never happens but she felt really bad.  Currently the congestion and cold symptoms are much better. 2- would like her thyroid checked.  She has been exercising and eating mainly salads but it not losing weight and wants to make sure everything is ok.  3- fell this am on ice and landed on her right shoulder and it is really sore and she wants to make sure everything is ok.  She is having no elbow or wrist pain.  The pain seems more in the muscle but her bones do not hurt.  She has taken nothing for the pain.  Has tried to let all the negatives go and she is much better since she has started this.  Working out in gym since Oct 2016 (MWF am - at the gym with a trainer and MW night - water aerobics) - not losing weight but clothes are fitting looser  Review of Systems  Respiratory: Positive for shortness of breath.        No h/o asthma, nonsmoker (quit 10 year ago)  Musculoskeletal: Negative for joint swelling.      Objective:   Physical Exam  Constitutional: She is oriented to person, place, and time. She appears well-developed and well-nourished.  BP 142/72 mmHg  Pulse 71  Temp(Src) 98.6 F (37 C) (Oral)  Resp 16  Ht 5' (1.524 m)  Wt 197 lb 6.4 oz (89.54 kg)  BMI 38.55 kg/m2  SpO2 94%  LMP 01/16/2001   HENT:  Head: Normocephalic and atraumatic.  Right Ear: External ear normal.  Left Ear: External ear normal.  Cardiovascular: Normal rate, regular rhythm and normal heart sounds.   No  murmur heard. Pulmonary/Chest: Effort normal and breath sounds normal. She has no wheezes.  Musculoskeletal:       Right shoulder: She exhibits decreased range of motion (decrease abduction). She exhibits no tenderness, no bony tenderness and no spasm.       Left shoulder: Normal.  Pt has minimal ROM when she was 1st examined but towards the end of the visit she has significant improvement in her ROM of her shoulder.  Good cap refills.  NL ROM of elbow and wrist on the right side.  Neurological: She is alert and oriented to person, place, and time.  Skin: Skin is warm and dry.  Psychiatric: She has a normal mood and affect. Her behavior is normal. Judgment and thought content normal.   [facility] reading (PRIMARY) by  Dr. Neva Seat.  Retrocardiac infiltrate ? LLL PNA, nl shoulder.  Results for orders placed or performed in visit on 01/17/15  POCT CBC  Result Value Ref Range   WBC 11.4 (A) 4.6 - 10.2 K/uL   Lymph, poc 2.0 0.6 - 3.4   POC LYMPH PERCENT 17.6 10 - 50 %L   MID (cbc) 0.5 0 - 0.9   POC MID % 4.6 0 - 12 %  M   POC Granulocyte 8.9 (A) 2 - 6.9   Granulocyte percent 77.8 37 - 80 %G   RBC 4.74 4.04 - 5.48 M/uL   Hemoglobin 13.3 12.2 - 16.2 g/dL   HCT, POC 16.141.5 09.637.7 - 47.9 %   MCV 87.5 80 - 97 fL   MCH, POC 28.1 27 - 31.2 pg   MCHC 32.1 31.8 - 35.4 g/dL   RDW, POC 04.513.8 %   Platelet Count, POC 362 142 - 424 K/uL   MPV 7.5 0 - 99.8 fL       Assessment & Plan:  SOB (shortness of breath) - Wells criteria is 0 -- Plan: EKG 12-Lead, DG Chest 2 View, COMPLETE METABOLIC PANEL WITH GFR, POCT CBC  Right shoulder injury, initial encounter - seems to be improving as she gets further from the fall - she will f/u if she is having problems - there is concern for a rotator cuff injury but she is having to much pain with movement during the exam to do a full shoulder exam - Plan: DG Shoulder Right  Elevated BP - believe related to her fall - Plan: TSH  Bacterial pneumonia - Plan: azithromycin  (ZITHROMAX) 500 MG tablet, Guaifenesin (MUCINEX MAXIMUM STRENGTH) 1200 MG TB12  Benny LennertSarah Ayzia Day PA-C  [facility] 01/17/2015 6:50 PM

## 2015-01-17 NOTE — Patient Instructions (Signed)
I will contact you with your lab results as soon as they are available.   If you have not heard from me in 2 weeks, please contact me.  The fastest way to get your results is to register for My Chart (see the instructions on the last page of this printout).   

## 2015-01-19 NOTE — Progress Notes (Signed)
Xray read and patient discussed with Anne Sims. Agree with assessment and plan of care per her note.   

## 2015-01-25 ENCOUNTER — Telehealth: Payer: Self-pay

## 2015-01-25 NOTE — Telephone Encounter (Signed)
No further discussion needed.

## 2015-01-25 NOTE — Telephone Encounter (Signed)
Patient returned phone call about lab results. Lab personnel not available so I delivered Jethro PolingSarah Weber's message as follows: "Thyroid is normal. 1 of her liver enzymes is slightly elevated but it is nothing that she has to worry about because she feels fine." Patient states that she is feeling a little tired but she thinks that is from the pneumonia. Please call back if further discussion is needed.   [phone]

## 2015-05-31 ENCOUNTER — Encounter: Payer: Self-pay | Admitting: *Deleted

## 2015-10-28 ENCOUNTER — Ambulatory Visit (INDEPENDENT_AMBULATORY_CARE_PROVIDER_SITE_OTHER): Payer: Self-pay | Admitting: Emergency Medicine

## 2015-10-28 VITALS — BP 130/82 | HR 71 | Temp 98.2°F | Resp 18 | Ht 60.0 in | Wt 193.0 lb

## 2015-10-28 DIAGNOSIS — F411 Generalized anxiety disorder: Secondary | ICD-10-CM

## 2015-10-28 MED ORDER — CITALOPRAM HYDROBROMIDE 40 MG PO TABS: 40.0000 mg | ORAL_TABLET | Freq: Every day | ORAL | Status: DC

## 2015-10-28 NOTE — Patient Instructions (Signed)
Generalized Anxiety Disorder Generalized anxiety disorder (GAD) is a mental disorder. It interferes with life functions, including relationships, work, and school. GAD is different from normal anxiety, which everyone experiences at some point in their lives in response to specific life events and activities. Normal anxiety actually helps us prepare for and get through these life events and activities. Normal anxiety goes away after the event or activity is over.  GAD causes anxiety that is not necessarily related to specific events or activities. It also causes excess anxiety in proportion to specific events or activities. The anxiety associated with GAD is also difficult to control. GAD can vary from mild to severe. People with severe GAD can have intense waves of anxiety with physical symptoms (panic attacks).  SYMPTOMS The anxiety and worry associated with GAD are difficult to control. This anxiety and worry are related to many life events and activities and also occur more days than not for 6 months or longer. People with GAD also have three or more of the following symptoms (one or more in children):  Restlessness.   Fatigue.  Difficulty concentrating.   Irritability.  Muscle tension.  Difficulty sleeping or unsatisfying sleep. DIAGNOSIS GAD is diagnosed through an assessment by your health care provider. Your health care provider will ask you questions aboutyour mood,physical symptoms, and events in your life. Your health care provider may ask you about your medical history and use of alcohol or drugs, including prescription medicines. Your health care provider may also do a physical exam and blood tests. Certain medical conditions and the use of certain substances can cause symptoms similar to those associated with GAD. Your health care provider may refer you to a mental health specialist for further evaluation. TREATMENT The following therapies are usually used to treat GAD:    Medication. Antidepressant medication usually is prescribed for long-term daily control. Antianxiety medicines may be added in severe cases, especially when panic attacks occur.   Talk therapy (psychotherapy). Certain types of talk therapy can be helpful in treating GAD by providing support, education, and guidance. A form of talk therapy called cognitive behavioral therapy can teach you healthy ways to think about and react to daily life events and activities.  Stress managementtechniques. These include yoga, meditation, and exercise and can be very helpful when they are practiced regularly. A mental health specialist can help determine which treatment is best for you. Some people see improvement with one therapy. However, other people require a combination of therapies.   This information is not intended to replace advice given to you by your health care provider. Make sure you discuss any questions you have with your health care provider.   Document Released: 04/03/2013 Document Revised: 12/28/2014 Document Reviewed: 04/03/2013 Elsevier Interactive Patient Education 2016 [employer]  

## 2015-10-28 NOTE — Progress Notes (Signed)
Subjective:  Patient ID: Anne Sims, female    DOB: [birth year]  Age: 59 y.o. MRN: [id number]  CC: Medication Refill   HPI Anne Sims presents  with a history of anxiety treated with Celexa. She said she been on Celexa for "forever". She's tolerating medication with no adverse side effects. She has no difficulty sleeping. She has no depression. No suicidal thoughts or thoughts of harm to others. She is requesting a refill on her medication  History Anne Sims has a past medical history of Hypertension and Anxiety.   She has past surgical history that includes Cholecystectomy and Abdominal hysterectomy.   Her  family history includes Cancer in her father; Healthy in her sister; Heart disease in her father and mother; High Cholesterol in her father; Hypertension in her father.  She   reports that she quit smoking about 11 years ago. She does not have any smokeless tobacco history on file. She reports that she does not drink alcohol or use illicit drugs.  Outpatient Prescriptions Prior to Visit  Medication Sig Dispense Refill  . citalopram (CELEXA) 40 MG tablet Take 40 mg by mouth daily.    Marland Kitchen azithromycin (ZITHROMAX) 500 MG tablet Take 1 tablet (500 mg total) by mouth daily. (Patient not taking: Reported on 10/28/2015) 5 tablet 0   No facility-administered medications prior to visit.    Social History   Social History  . Marital Status: Single    Spouse Name: N/A  . Number of Children: N/A  . Years of Education: N/A   Social History Main Topics  . Smoking status: Former Smoker -- 37 years    Quit date: 06/20/2004  . Smokeless tobacco: None  . Alcohol Use: No  . Drug Use: No  . Sexual Activity: Not Asked   Other Topics Concern  . None   Social History Narrative     Review of Systems  Constitutional: Negative for fever, chills and appetite change.  HENT: Negative for congestion, ear pain, postnasal drip, sinus pressure and sore throat.   Eyes: Negative for  pain and redness.  Respiratory: Negative for cough, shortness of breath and wheezing.   Cardiovascular: Negative for leg swelling.  Gastrointestinal: Negative for nausea, vomiting, abdominal pain, diarrhea, constipation and blood in stool.  Endocrine: Negative for polyuria.  Genitourinary: Negative for dysuria, urgency, frequency and flank pain.  Musculoskeletal: Negative for gait problem.  Skin: Negative for rash.  Neurological: Negative for weakness and headaches.  Psychiatric/Behavioral: Negative for confusion and decreased concentration. The patient is not nervous/anxious.     Objective:  BP 130/82 mmHg  Pulse 71  Temp(Src) 98.2 F (36.8 C) (Oral)  Resp 18  Ht 5' (1.524 m)  Wt 193 lb (87.544 kg)  BMI 37.69 kg/m2  SpO2 97%  LMP 01/16/2001  Physical Exam  Constitutional: She is oriented to person, place, and time. She appears well-developed and well-nourished.  HENT:  Head: Normocephalic and atraumatic.  Eyes: Conjunctivae are normal. Pupils are equal, round, and reactive to light.  Pulmonary/Chest: Effort normal.  Musculoskeletal: She exhibits no edema.  Neurological: She is alert and oriented to person, place, and time.  Skin: Skin is dry.  Psychiatric: She has a normal mood and affect. Her behavior is normal. Thought content normal.      Assessment & Plan:   Zaneta was seen today for medication refill.  Diagnoses and all orders for this visit:  Anxiety state  Other orders -     citalopram (CELEXA) 40 MG tablet;  Take 1 tablet (40 mg total) by mouth daily.   I have changed Ms. Icenhour's citalopram. I am also having her maintain her azithromycin.  Meds ordered this encounter  Medications  . citalopram (CELEXA) 40 MG tablet    Sig: Take 1 tablet (40 mg total) by mouth daily.    Dispense:  30 tablet    Refill:  12    Appropriate red flag conditions were discussed with the patient as well as actions that should be taken.  Patient expressed his  understanding.  Follow-up: Return if symptoms worsen or fail to improve.  Carmelina DaneAnderson, Emmanuel Ercole S, MD

## 2016-07-30 ENCOUNTER — Ambulatory Visit (INDEPENDENT_AMBULATORY_CARE_PROVIDER_SITE_OTHER): Payer: BLUE CROSS/BLUE SHIELD | Admitting: Physician Assistant

## 2016-07-30 VITALS — BP 126/80 | HR 89 | Temp 98.5°F | Resp 18 | Ht 60.0 in | Wt 201.0 lb

## 2016-07-30 DIAGNOSIS — G4700 Insomnia, unspecified: Secondary | ICD-10-CM

## 2016-07-30 DIAGNOSIS — Z13220 Encounter for screening for lipoid disorders: Secondary | ICD-10-CM

## 2016-07-30 DIAGNOSIS — Z8669 Personal history of other diseases of the nervous system and sense organs: Secondary | ICD-10-CM

## 2016-07-30 DIAGNOSIS — F419 Anxiety disorder, unspecified: Secondary | ICD-10-CM | POA: Diagnosis not present

## 2016-07-30 DIAGNOSIS — F513 Sleepwalking [somnambulism]: Secondary | ICD-10-CM | POA: Diagnosis not present

## 2016-07-30 DIAGNOSIS — H539 Unspecified visual disturbance: Secondary | ICD-10-CM | POA: Diagnosis not present

## 2016-07-30 DIAGNOSIS — R5383 Other fatigue: Secondary | ICD-10-CM

## 2016-07-30 DIAGNOSIS — Z87898 Personal history of other specified conditions: Secondary | ICD-10-CM

## 2016-07-30 DIAGNOSIS — Z114 Encounter for screening for human immunodeficiency virus [HIV]: Secondary | ICD-10-CM | POA: Diagnosis not present

## 2016-07-30 DIAGNOSIS — Z1159 Encounter for screening for other viral diseases: Secondary | ICD-10-CM | POA: Diagnosis not present

## 2016-07-30 DIAGNOSIS — R0683 Snoring: Secondary | ICD-10-CM

## 2016-07-30 LAB — CBC WITH DIFFERENTIAL/PLATELET
BASOS ABS: 0 {cells}/uL (ref 0–200)
BASOS PCT: 0 %
EOS PCT: 0 %
Eosinophils Absolute: 0 cells/uL — ABNORMAL LOW (ref 15–500)
HCT: 38.7 % (ref 35.0–45.0)
HEMOGLOBIN: 12.4 g/dL (ref 11.7–15.5)
LYMPHS ABS: 2838 {cells}/uL (ref 850–3900)
Lymphocytes Relative: 33 %
MCH: 26.5 pg — AB (ref 27.0–33.0)
MCHC: 32 g/dL (ref 32.0–36.0)
MCV: 82.7 fL (ref 80.0–100.0)
MONOS PCT: 7 %
MPV: 9.2 fL (ref 7.5–12.5)
Monocytes Absolute: 602 cells/uL (ref 200–950)
NEUTROS ABS: 5160 {cells}/uL (ref 1500–7800)
Neutrophils Relative %: 60 %
PLATELETS: 401 10*3/uL — AB (ref 140–400)
RBC: 4.68 MIL/uL (ref 3.80–5.10)
RDW: 15.2 % — ABNORMAL HIGH (ref 11.0–15.0)
WBC: 8.6 10*3/uL (ref 3.8–10.8)

## 2016-07-30 LAB — LIPID PANEL
CHOL/HDL RATIO: 3.9 ratio (ref <=5.0)
CHOLESTEROL: 233 mg/dL — AB (ref 125–200)
HDL: 60 mg/dL (ref >=46)
LDL Cholesterol: 135 mg/dL — ABNORMAL HIGH (ref <130)
Triglycerides: 192 mg/dL — ABNORMAL HIGH (ref <150)
VLDL: 38 mg/dL — ABNORMAL HIGH (ref <30)

## 2016-07-30 LAB — COMPLETE METABOLIC PANEL WITH GFR
ALBUMIN: 4.1 g/dL (ref 3.6–5.1)
ALK PHOS: 110 U/L (ref 33–130)
ALT: 32 U/L — ABNORMAL HIGH (ref 6–29)
AST: 33 U/L (ref 10–35)
BUN: 20 mg/dL (ref 7–25)
CALCIUM: 9.4 mg/dL (ref 8.6–10.4)
CHLORIDE: 103 mmol/L (ref 98–110)
CO2: 27 mmol/L (ref 20–31)
Creat: 0.81 mg/dL (ref 0.50–0.99)
GFR, EST NON AFRICAN AMERICAN: 79 mL/min (ref >=60)
Glucose, Bld: 93 mg/dL (ref 65–99)
POTASSIUM: 4.6 mmol/L (ref 3.5–5.3)
SODIUM: 138 mmol/L (ref 135–146)
Total Bilirubin: 0.6 mg/dL (ref 0.2–1.2)
Total Protein: 7.4 g/dL (ref 6.1–8.1)

## 2016-07-30 LAB — TSH: TSH: 2.27 m[IU]/L

## 2016-07-30 MED ORDER — TRAZODONE HCL 50 MG PO TABS: 25.0000 mg | ORAL_TABLET | Freq: Every evening | ORAL | 0 refills | Status: DC | PRN

## 2016-07-30 NOTE — Patient Instructions (Addendum)
  Sign up for mychart to communicate with me  To start the Trazodone - start with 1/2 pill at night for the 1st 4 nights - increase the dose by 25mg  (1/2 pill) every 4-5 nights until either you sleep well, have side effects or reach a nightly dose of 150mg     IF you received an x-ray today, you will receive an invoice from [facility]. Please contact [facility] at [phone] with questions or concerns regarding your invoice.   IF you received labwork today, you will receive an invoice from [facility]. Please contact [facility] at [phone] with questions or concerns regarding your invoice.   Our billing staff will not be able to assist you with questions regarding bills from these companies.  You will be contacted with the lab results as soon as they are available. The fastest way to get your results is to activate your My Chart account. Instructions are located on the last page of this paperwork. If you have not heard from us regarding the results in 2 weeks, please contact this office.    We recommend that you schedule a mammogram for breast cancer screening. Typically, you do not need a referral to do this. Please contact a local imaging center to schedule your mammogram.  [facility] - [phone]  *ask for the Radiology Department [facility] - [phone] or [phone]  [facility] - [phone] [facility] - [phone] [facility] - [phone]  *ask for the Radiology Department [facility] - [phone]  *ask for the Radiology Department [facility] - [phone]  *ask for the Mammography Department [facility] - [phone]) [phone]

## 2016-07-30 NOTE — Progress Notes (Signed)
Subjective:    Patient ID: Anne Sims, female    DOB: 05/16/1956, 60 y.o.   MRN: [id number]  HPI  Patient presents today for evaluation of multiple issues.  1- Got new glasses 2 months ago, but did not see an ophalmologist Feels like she can't see well out of RIGHT eye but left eye is normal. She feels like her vision in the right eye is cloudy, "like looking out of a cheese cloth". It is the same with and without the glasses. Cataracts run in her family.  2-Patient fell on her RIGHT shoulder 1.5 years ago and had an X-ray that showed no bony injury. She is still having pain in right shoulder but only with movement - it is not keeping her up at night. ROM is good but sometimes painful, patient makes an effort to move shoulder so it doesn't freeze. No swelling or deformity. She is not taking anything for the pain, has not tried heat or ice. She is not currently interested in getting an MRI or going to PT.  3- Patient feels like her anxiety and depression have worsened. She has been taking citalopram 40 mg daily for many years. Her sister was on the same medication for anxiety and recently was told by a doctor that women over 60 should not be on that high of a dose. Patient feels like her anxiety level is at an extreme level constantly and has panic sxs 1-2x per week. She is able to soothe herself with deep breathing and talking herself down. She picks at her skin often, has difficulty concentrating, feels like her memory is not as good, and feels fatigued all the time. She is not sleeping well and does not wake up feeling rested. She has been taking Z-quil for at least 2 years now and has not felt any less fatigued. She snores - she had sleep apnea before her gastric bypass and she has gained 50 lbs. She has a lot of work and financial related stress. She also seems to maybe have a strained relationship with her husband. She has taken wellbutrin in the past and one other medication she can't  remember the name of.  4- She has had a gastric bypass and notes that she has gained back 50 lbs. Patient says she is a stress eater and also gets out of bed to eat during the night without remembering. Patient also reports snoring and that she has a history of sleep apnea.  Patient interested in Hep C testing and HIV testing.  Review of Systems  Constitutional: Positive for fatigue.  Eyes: Positive for visual disturbance. Negative for photophobia, pain, discharge and redness.  Musculoskeletal: Positive for arthralgias. Negative for joint swelling.  Hematological: Bruises/bleeds easily.  Psychiatric/Behavioral: Positive for decreased concentration, dysphoric mood and sleep disturbance. The patient is nervous/anxious.     Current Outpatient Prescriptions:  .  citalopram (CELEXA) 40 MG tablet, Take 1 tablet (40 mg total) by mouth daily., Disp: 90 tablet, Rfl: 3 .  traZODone (DESYREL) 50 MG tablet, Take 0.5-2 tablets (25-100 mg total) by mouth at bedtime as needed for sleep., Disp: 60 tablet, Rfl: 0   No Known Allergies    Objective:   Physical Exam  Constitutional: She is oriented to person, place, and time. She appears well-developed and well-nourished. She is cooperative. No distress.  Blood pressure 126/80, pulse 89, temperature 98.5 F (36.9 C), temperature source Oral, resp. rate 18, height 5' (1.524 m), weight 201 lb (91.2 kg), last  menstrual period 01/16/2001, SpO2 96 %.  HENT:  Head: Normocephalic and atraumatic.  Eyes: Conjunctivae and lids are normal. Right eye exhibits no discharge and no exudate. Left eye exhibits no discharge and no exudate. No scleral icterus.  Fundoscopic exam:      The right eye shows red reflex.       The left eye shows red reflex.  Neck: Trachea normal and normal range of motion. Neck supple.  Cardiovascular: Normal rate, regular rhythm and normal heart sounds.   Pulses:      Radial pulses are 2+ on the right side, and 2+ on the left side.    Pulmonary/Chest: Effort normal and breath sounds normal. She has no wheezes.  Musculoskeletal:       Right shoulder: She exhibits pain (with forward extension and raising over head. With raising arm against resistance.). She exhibits normal range of motion, no tenderness, no bony tenderness and no deformity.       Left shoulder: Normal.  Lymphadenopathy:    She has no cervical adenopathy.  Neurological: She is alert and oriented to person, place, and time.  Skin: Skin is warm and dry. She is not diaphoretic. No pallor.     Psychiatric: She has a normal mood and affect. Her speech is normal and behavior is normal. Judgment and thought content normal.       Assessment & Plan:   Vision changes - Plan: Ambulatory referral to Ophthalmology  Need for hepatitis C screening test - Plan: Hepatitis C antibody  Encounter for screening for HIV - Plan: HIV antibody  Anxiety - pt is able to manage her symptoms - she is ok to continue on her Celexa dose for now -- we will try to improve her sleep and see if it improves - it not we will consider a switch to a SNRI  Other fatigue - Plan: Ambulatory referral to Neurology, CBC with Differential/Platelet, COMPLETE METABOLIC PANEL WITH GFR, TSH - check labs - order sleep study due to history - also likely related to stress levels and anxiety/depression  Sleep walking and eating - Plan: Ambulatory referral to Neurology - d/w pt ways to decrease her nighttime/sleeping eating  Screening cholesterol level - Plan: Lipid panel  Insomnia - Plan: traZODone (DESYREL) 50 MG tablet - trial trazodone -   Snoring - Plan: Ambulatory referral to Neurology  H/O sleep apnea - Plan: Ambulatory referral to Neurology -  Right shoulder pain - pt is not interested in further evaluation or treatment at this time  Benny Lennert PA-C  [facility] 08/01/2016 1:38 PM

## 2016-07-31 LAB — HIV ANTIBODY (ROUTINE TESTING W REFLEX): HIV 1&2 Ab, 4th Generation: NONREACTIVE

## 2016-07-31 LAB — HEPATITIS C ANTIBODY: HCV Ab: NEGATIVE

## 2016-08-03 ENCOUNTER — Encounter: Payer: Self-pay | Admitting: Physician Assistant

## 2016-08-05 ENCOUNTER — Encounter: Payer: Self-pay | Admitting: Physician Assistant

## 2016-09-03 ENCOUNTER — Ambulatory Visit (INDEPENDENT_AMBULATORY_CARE_PROVIDER_SITE_OTHER): Payer: BLUE CROSS/BLUE SHIELD | Admitting: Neurology

## 2016-09-03 ENCOUNTER — Encounter: Payer: Self-pay | Admitting: Neurology

## 2016-09-03 VITALS — BP 110/78 | HR 56 | Resp 20 | Ht 60.0 in | Wt 198.0 lb

## 2016-09-03 DIAGNOSIS — R001 Bradycardia, unspecified: Secondary | ICD-10-CM | POA: Diagnosis not present

## 2016-09-03 DIAGNOSIS — R0683 Snoring: Secondary | ICD-10-CM | POA: Diagnosis not present

## 2016-09-03 DIAGNOSIS — G4733 Obstructive sleep apnea (adult) (pediatric): Secondary | ICD-10-CM | POA: Diagnosis not present

## 2016-09-03 DIAGNOSIS — T50905A Adverse effect of unspecified drugs, medicaments and biological substances, initial encounter: Secondary | ICD-10-CM

## 2016-09-03 NOTE — Progress Notes (Signed)
SLEEP MEDICINE CLINIC   Provider:  Melvyn Novas, M D  Referring Provider: Larkin Ina Primary Care Physician:  Virgilio Belling  Chief Complaint  Patient presents with  . New Patient (Initial Visit)    had sleep study years ago, was on cpap, not using it, lost weight    HPI:  Anne Sims is a 60 y.o. female , seen here as a referral  from [place] Benny Lennert for a sleep consultation,  Chief complaint according to patient : " I cannot sleep "   [name] presents today with a long history of insomnia. She describes an inability to fall asleep and stay asleep, this has been present for decades. Worsening is that her husband is a loud snorer which also keeps her from getting restful sleep. He however is not willing to undergo a sleep evaluation. She reports that her primary care physician has started her on trazodone which has helped and she has gotten more sleep recently than in a long time. In the past she had once tried Ambien but became asleep eater and sleep walker, she actually cooked in her sleep. Today she endorsed the Epworth Sleepiness Scale at 9 points and the fatigue severity scale at 37 points the geriatric depression scale it only 2 out of 15 points. She further endorsed memory loss, daytime sleepiness, weight gain, restless legs, snoring anxiety the feeling of not getting enough sleep. She also endorsed ringing in her ears and easy bruising.  She has a history of depression and anxiety but has been treated and she endorsed accordingly a low score on the geriatric depression score.  She was treated with Wellbutrin, Citalopram. Gained 50 pounds. She underwent gastric bypass surgery in 2011 leading to a weight loss of 114 pounds. Prior to gastric bypass surgery she had been diagnosed with obstructive sleep apnea by being very heavy and CPAP was prescribed. She discontinued the use of CPAP after the surgery and the resulting weight loss. Further surgery history  includes a hysterectomy in 2002.   Sleep habits are as follows: She sleeps in the same bedroom as her husband. She sleep on her side, sometimes supine. She sleeps in a fetal position. One pillow, bedroom is quiet , cool and dark.  She does not use a fan or humidifier in bedroom. She has not uses CPAP for the last 6 years about. She usually goes to bed at 9:30 PM, but it can take up to 2 hours to fall asleep. This has been helped by the use of trazodone. He also has restless legs. It comes back periodically sometimes it's not present for weeks. She wakes frequently up at 1:30 AM and again at 3:30 AM. It's not the urge to urinate that wakes her that she may use these breaks for a bathroom trip. She gets up in the morning at about 7:15 AM the latest she will wake up with 8 AM. She now feels refreshed in the morning after she has started taking trazodone. Before this was not the case and was not the case for years. Husband stated she snores.  Wife stated he snores- she has noted more memory lapses in morning hours.  She has amnesia for a recent trip to [place]!   Sleep medical history and family sleep history:  Insomnia for decades.   Social history:  Married, independent Agricultural engineer. 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   Review of Systems: Out of a complete 14 system review, the patient complains of only  the following symptoms, and all other reviewed systems are negative.  Epworth score 9 , Fatigue severity score 37  , depression score 2/15, Obesity , tinnitus, amnestic events, snoring.    Social History   Social History  . Marital status: Married    Spouse name: N/A  . Number of children: 1  . Years of education: Some college   Occupational History  . Owns own business     craft store   Social History Main Topics  . Smoking status: Former Smoker    Years: 37.00    Quit date: 06/20/2004  . Smokeless tobacco: Never Used  . Alcohol use No  . Drug use: No  . Sexual activity: Not Currently    Other Topics Concern  . Not on file   Social History Narrative   Married   Own[place] own business - card [place]    Family History  Problem Relation Age of Onset  . Cancer Father     esophageal  . Heart disease Father   . Hypertension Father   . High Cholesterol Father   . Heart disease Mother   . Healthy Sister     Past Medical History:  Diagnosis Date  . Anxiety   . Hypertension     Past Surgical History:  Procedure Laterality Date  . ABDOMINAL HYSTERECTOMY    . CHOLECYSTECTOMY      Current Outpatient Prescriptions  Medication Sig Dispense Refill  . citalopram (CELEXA) 40 MG tablet Take 1 tablet (40 mg total) by mouth daily. 90 tablet 3  . traZODone (DESYREL) 50 MG tablet Take 0.5-2 tablets (25-100 mg total) by mouth at bedtime as needed for sleep. 60 tablet 0   No current facility-administered medications for this visit.     Allergies as of 09/03/2016  . (No Known Allergies)    Vitals: BP 110/78   Pulse (!) 56   Resp 20   Ht 5' (1.524 m)   Wt 198 lb (89.8 kg)   LMP 01/16/2001   BMI 38.67 kg/m  Last Weight:  Wt Readings from Last 1 Encounters:  09/03/16 198 lb (89.8 kg)   WUJ:WJXBBMI:Body mass index is 38.67 kg/m.     Last Height:   Ht Readings from Last 1 Encounters:  09/03/16 5' (1.524 m)    Physical exam:  General: The patient is awake, alert and appears not in acute distress. The patient is well groomed. Head: Normocephalic, atraumatic. Neck is supple. Mallampati 4,  neck circumference:16. Nasal airflow patent , TMJ is not evident . Retrognathia is seen. Full set of dentures.  Cardiovascular:  Regular rate and rhythm, without  murmurs or carotid bruit, and without distended neck veins. Respiratory: Lungs are clear to auscultation. Skin:  Without evidence of edema, or rash Trunk: BMI is elevated . The patient's posture is erect.  Neurologic exam : The patient is awake and alert, oriented to place and time.   Memory subjective  described as intact. Amnestic events.  Memory testing deferred.   MOCA:No flowsheet data found. MMSE:No flowsheet data found.  Attention span & concentration ability appears normal.  Speech is fluent,  without dysarthria, dysphonia or aphasia.  Mood and affect are appropriate.  Cranial nerves: Pupils are equal and briskly reactive to light. Funduscopic exam without evidence of pallor or edema.  Extraocular movements  in vertical and horizontal planes intact and without nystagmus. Visual fields by finger perimetry are intact. Hearing to finger rub intact. Facial sensation intact to fine touch.  Facial motor strength is symmetric and tongue and uvula move midline. Shoulder shrug was symmetrical.  Motor exam:  Normal tone, muscle bulk and symmetric strength in all extremities.Sensory:  Fine touch, pinprick and vibration were tested in all extremities. Proprioception tested in the upper extremities was normal. Coordination: Rapid alternating movements in the fingers/hands was normal. Finger-to-nose maneuver normal without evidence of ataxia, dysmetria or tremor. Gait and station: Patient walks without assistive device and is able unassisted to climb up to the exam table. Strength within normal limits.Stance is stable and normal.   Deep tendon reflexes: in the upper and lower extremities are symmetric and intact.  The patient was advised of the nature of the diagnosed sleep disorder , the treatment options and risks for general a health and wellness arising from not treating the condition.  I spent more than 45 minutes of face to face time with the patient. Greater than 50% of time was spent in counseling and coordination of care. We have discussed the diagnosis and differential and I answered the patient's questions.     Assessment:  After physical and neurologic examination, review of laboratory studies,  Personal review of imaging studies, reports of other /same  Imaging studies ,  Results of  polysomnography/ neurophysiology testing and pre-existing records as far as provided in visit., my assessment is   1) this patient has all the risk factors for sleep apnea, and I strongly believe that her insomnia is related partially to OSA.   2) depression has been treated .   3) insomnia.   Plan:  Treatment plan and additional workup :  PSG with [facility],  Stay on Trazodone. RV with MOCA ! In 1- 2 month     Porfirio Mylar Russ Looper MD  09/03/2016   CC: Morrell Riddle, Pa-c [address] [place], [place] [address]

## 2016-09-17 ENCOUNTER — Encounter: Payer: Self-pay | Admitting: Physician Assistant

## 2016-09-18 NOTE — Telephone Encounter (Signed)
Anne Sims, you saw pt (husband Anne Sims)  in Jan and wrote about his cataracts in OV notes, but wasn't sure if you would need more recent exam for referral?

## 2016-09-22 ENCOUNTER — Telehealth: Payer: Self-pay | Admitting: Neurology

## 2016-09-22 NOTE — Telephone Encounter (Signed)
Pt returned Dawn's call

## 2016-10-06 ENCOUNTER — Ambulatory Visit (INDEPENDENT_AMBULATORY_CARE_PROVIDER_SITE_OTHER): Payer: BLUE CROSS/BLUE SHIELD | Admitting: Neurology

## 2016-10-06 DIAGNOSIS — G4733 Obstructive sleep apnea (adult) (pediatric): Secondary | ICD-10-CM

## 2016-10-06 DIAGNOSIS — R0683 Snoring: Secondary | ICD-10-CM

## 2016-10-15 ENCOUNTER — Telehealth: Payer: Self-pay

## 2016-10-15 NOTE — Telephone Encounter (Signed)
I spoke to pt regarding her sleep study results. I advised her that her sleep study did not reveal significant sleep apnea. However, loud snoring and related arousals were noted, along with frequent PLMs. Dr. Vickey Hugerohmeier recommends a dental device or ENT evaluation for snoring treatment, if snoring is a clinical concern. PLMs were frequent but not causing many arousals. I offered pt a follow up to discuss pharmacotherapy for possible RLS. Pt is agreeable to a follow up with Dr. Vickey Hugerohmeier. An appt was made for 11/09/2016 at 9:00am. Pt declined snoring treatment at this time. Pt verbalized understanding of results. Pt had no questions at this time but was encouraged to call back if questions arise.

## 2016-10-18 ENCOUNTER — Encounter: Payer: Self-pay | Admitting: Physician Assistant

## 2016-10-19 NOTE — Telephone Encounter (Signed)
Anne Sims, plan at 8/10 OV was to follow up in 4 wks, but with pt's report above, didn't know if you needed to see her now? Pended 90 day RFs of both. Changed sig and # on trazodone to pt's reported effective dose.

## 2016-10-22 MED ORDER — CITALOPRAM HYDROBROMIDE 40 MG PO TABS: 40.0000 mg | ORAL_TABLET | Freq: Every day | ORAL | 0 refills | Status: DC

## 2016-10-22 MED ORDER — TRAZODONE HCL 50 MG PO TABS: 25.0000 mg | ORAL_TABLET | Freq: Every evening | ORAL | 0 refills | Status: DC | PRN

## 2016-10-22 NOTE — Telephone Encounter (Signed)
Done

## 2016-10-27 ENCOUNTER — Ambulatory Visit (INDEPENDENT_AMBULATORY_CARE_PROVIDER_SITE_OTHER): Payer: BLUE CROSS/BLUE SHIELD | Admitting: Physician Assistant

## 2016-10-27 VITALS — BP 124/70 | HR 79 | Temp 98.1°F | Resp 18 | Ht 60.0 in | Wt 198.4 lb

## 2016-10-27 DIAGNOSIS — F419 Anxiety disorder, unspecified: Secondary | ICD-10-CM | POA: Diagnosis not present

## 2016-10-27 DIAGNOSIS — F5101 Primary insomnia: Secondary | ICD-10-CM | POA: Insufficient documentation

## 2016-10-27 MED ORDER — TRAZODONE HCL 50 MG PO TABS: 25.0000 mg | ORAL_TABLET | Freq: Every evening | ORAL | 1 refills | Status: DC | PRN

## 2016-10-27 MED ORDER — CITALOPRAM HYDROBROMIDE 40 MG PO TABS: 40.0000 mg | ORAL_TABLET | Freq: Every day | ORAL | 1 refills | Status: DC

## 2016-10-27 NOTE — Patient Instructions (Signed)
     IF you received an x-ray today, you will receive an invoice from [facility]. Please contact [facility] at [phone] with questions or concerns regarding your invoice.   IF you received labwork today, you will receive an invoice from [facility]. Please contact [facility] at [phone] with questions or concerns regarding your invoice.   Our billing staff will not be able to assist you with questions regarding bills from these companies.  You will be contacted with the lab results as soon as they are available. The fastest way to get your results is to activate your My Chart account. Instructions are located on the last page of this paperwork. If you have not heard from us regarding the results in 2 weeks, please contact this office.      

## 2016-10-27 NOTE — Progress Notes (Signed)
   Anne Sims  MRN: [id number] DOB: 12/04/1956  Subjective:  Pt presents to clinic for medication refills.  She has been doing good.    She has been diagnosed with restless leg through her sleep study which did not show sleep apnea.  She has an appt with the neurologist for treatment in 2 weeks.  She thought she had it but not to the extent that was shown on the sleep study.  Stress a lot - esp with work and the holiday season and money is always a problem - but she is doing well.  She has some interest in decreasing her celexa but now is not the time.  Review of Systems  Psychiatric/Behavioral: Positive for sleep disturbance (much improved on the trazodone). The patient is nervous/anxious (situational).     Patient Active Problem List   Diagnosis Date Noted  . Primary insomnia 10/27/2016  . Obesity, Class II, BMI 35-39.9 11/07/2013  . Hidradenitis 09/21/2012  . S/P gastric bypass 08/16/2012  . Anxiety 08/16/2012    No current outpatient prescriptions on file prior to visit.   No current facility-administered medications on file prior to visit.     No Known Allergies  Pt patients past, family and social history were reviewed and updated.   Objective:  BP 124/70   Pulse 79   Temp 98.1 F (36.7 C) (Oral)   Resp 18   Ht 5' (1.524 m)   Wt 198 lb 6.4 oz (90 kg)   LMP 01/16/2001   SpO2 96%   BMI 38.75 kg/m   Physical Exam  Constitutional: She is oriented to person, place, and time and well-developed, well-nourished, and in no distress.  HENT:  Head: Normocephalic and atraumatic.  Right Ear: Hearing and external ear normal.  Left Ear: Hearing and external ear normal.  Eyes: Conjunctivae are normal.  Neck: Normal range of motion.  Pulmonary/Chest: Effort normal.  Neurological: She is alert and oriented to person, place, and time. Gait normal.  Skin: Skin is warm and dry.  Psychiatric: Mood, memory, affect and judgment normal.  Vitals reviewed.   Assessment and  Plan :  Anxiety - Plan: citalopram (CELEXA) 40 MG tablet  Primary insomnia - Plan: traZODone (DESYREL) 50 MG tablet   Continue current medications for now.  Once her restless leg is controlled we will consider change in medications (decrease in Celexa and hopefully being able to stop the trazodone).  Her questions were answered and she garees with the plan.  Benny LennertSarah Iyahna Obriant PA-C  [facility] 10/27/2016 8:39 AM

## 2016-11-03 ENCOUNTER — Emergency Department (HOSPITAL_COMMUNITY)
Admission: EM | Admit: 2016-11-03 | Discharge: 2016-11-03 | Disposition: A | Discharge status: Home / Self Care | Payer: BLUE CROSS/BLUE SHIELD | Attending: Emergency Medicine | Admitting: Emergency Medicine

## 2016-11-03 ENCOUNTER — Emergency Department (HOSPITAL_COMMUNITY): Payer: BLUE CROSS/BLUE SHIELD

## 2016-11-03 ENCOUNTER — Encounter (HOSPITAL_COMMUNITY): Payer: Self-pay | Admitting: Emergency Medicine

## 2016-11-03 DIAGNOSIS — I10 Essential (primary) hypertension: Secondary | ICD-10-CM | POA: Insufficient documentation

## 2016-11-03 DIAGNOSIS — R1030 Lower abdominal pain, unspecified: Secondary | ICD-10-CM | POA: Diagnosis present

## 2016-11-03 DIAGNOSIS — Z87891 Personal history of nicotine dependence: Secondary | ICD-10-CM | POA: Diagnosis not present

## 2016-11-03 DIAGNOSIS — K529 Noninfective gastroenteritis and colitis, unspecified: Secondary | ICD-10-CM | POA: Insufficient documentation

## 2016-11-03 DIAGNOSIS — Z951 Presence of aortocoronary bypass graft: Secondary | ICD-10-CM | POA: Insufficient documentation

## 2016-11-03 HISTORY — DX: Obesity, unspecified: E66.9

## 2016-11-03 LAB — COMPREHENSIVE METABOLIC PANEL
ALBUMIN: 4 g/dL (ref 3.5–5.0)
ALT: 34 U/L (ref 14–54)
ANION GAP: 12 (ref 5–15)
AST: 35 U/L (ref 15–41)
Alkaline Phosphatase: 110 U/L (ref 38–126)
BUN: 18 mg/dL (ref 6–20)
CHLORIDE: 99 mmol/L — AB (ref 101–111)
CO2: 26 mmol/L (ref 22–32)
Calcium: 9.4 mg/dL (ref 8.9–10.3)
Creatinine, Ser: 0.85 mg/dL (ref 0.44–1.00)
GFR calc non Af Amer: >60 mL/min (ref >60)
GLUCOSE: 168 mg/dL — AB (ref 65–99)
Potassium: 3.9 mmol/L (ref 3.5–5.1)
SODIUM: 137 mmol/L (ref 135–145)
Total Bilirubin: 0.8 mg/dL (ref 0.3–1.2)
Total Protein: 7.6 g/dL (ref 6.5–8.1)

## 2016-11-03 LAB — CBC WITH DIFFERENTIAL/PLATELET
BASOS PCT: 0 %
Basophils Absolute: 0 10*3/uL (ref 0.0–0.1)
EOS ABS: 0 10*3/uL (ref 0.0–0.7)
EOS PCT: 0 %
HCT: 38.3 % (ref 36.0–46.0)
HEMOGLOBIN: 12.5 g/dL (ref 12.0–15.0)
LYMPHS ABS: 2.7 10*3/uL (ref 0.7–4.0)
Lymphocytes Relative: 17 %
MCH: 25.9 pg — AB (ref 26.0–34.0)
MCHC: 32.6 g/dL (ref 30.0–36.0)
MCV: 79.3 fL (ref 78.0–100.0)
MONOS PCT: 5 %
Monocytes Absolute: 0.7 10*3/uL (ref 0.1–1.0)
NEUTROS PCT: 78 %
Neutro Abs: 12 10*3/uL — ABNORMAL HIGH (ref 1.7–7.7)
PLATELETS: 377 10*3/uL (ref 150–400)
RBC: 4.83 MIL/uL (ref 3.87–5.11)
RDW: 16 % — ABNORMAL HIGH (ref 11.5–15.5)
WBC: 15.4 10*3/uL — AB (ref 4.0–10.5)

## 2016-11-03 LAB — URINE MICROSCOPIC-ADD ON: RBC / HPF: NONE SEEN RBC/hpf (ref 0–5)

## 2016-11-03 LAB — URINALYSIS, ROUTINE W REFLEX MICROSCOPIC
Bilirubin Urine: NEGATIVE
Glucose, UA: NEGATIVE mg/dL
Hgb urine dipstick: NEGATIVE
Ketones, ur: 15 mg/dL — AB
NITRITE: NEGATIVE
PH: 6.5 (ref 5.0–8.0)
Protein, ur: NEGATIVE mg/dL
SPECIFIC GRAVITY, URINE: 1.021 (ref 1.005–1.030)

## 2016-11-03 LAB — LIPASE, BLOOD: Lipase: 24 U/L (ref 11–51)

## 2016-11-03 MED ORDER — ONDANSETRON HCL 4 MG PO TABS: 4.0000 mg | ORAL_TABLET | Freq: Four times a day (QID) | ORAL | 0 refills | Status: AC

## 2016-11-03 MED ORDER — IOPAMIDOL (ISOVUE-300) INJECTION 61%
INTRAVENOUS | Status: AC
  Administered 2016-11-03: 100 mL
  Filled 2016-11-03: qty 100

## 2016-11-03 MED ORDER — ONDANSETRON HCL 4 MG/2ML IJ SOLN
4.0000 mg | Freq: Once | INTRAMUSCULAR | Status: AC
  Administered 2016-11-03: 4 mg via INTRAVENOUS
  Filled 2016-11-03: qty 2

## 2016-11-03 MED ORDER — SODIUM CHLORIDE 0.9 % IV BOLUS (SEPSIS)
500.0000 mL | Freq: Once | INTRAVENOUS | Status: AC
  Administered 2016-11-03: 500 mL via INTRAVENOUS

## 2016-11-03 MED ORDER — METRONIDAZOLE 500 MG PO TABS: 500.0000 mg | ORAL_TABLET | Freq: Three times a day (TID) | ORAL | 0 refills | Status: AC

## 2016-11-03 MED ORDER — CIPROFLOXACIN HCL 500 MG PO TABS: 500.0000 mg | ORAL_TABLET | Freq: Two times a day (BID) | ORAL | 0 refills | Status: AC

## 2016-11-03 MED ORDER — MORPHINE SULFATE (PF) 4 MG/ML IV SOLN
4.0000 mg | Freq: Once | INTRAVENOUS | Status: AC
Start: 2016-11-03 — End: 2016-11-03
  Administered 2016-11-03: 4 mg via INTRAVENOUS
  Filled 2016-11-03: qty 1

## 2016-11-03 MED ORDER — TRAMADOL HCL 50 MG PO TABS: 50.0000 mg | ORAL_TABLET | Freq: Four times a day (QID) | ORAL | 0 refills | Status: AC | PRN

## 2016-11-03 NOTE — ED Triage Notes (Signed)
Pt. reports mid abdominal pain with nausea onset this evening after eating Japanese ( Hibachi) food  , no emesis or diarrhea , denies fever or chills . She suspects food poisoning .

## 2016-11-03 NOTE — ED Notes (Signed)
EDP at bedside  

## 2016-11-03 NOTE — ED Notes (Signed)
Pt verbalized understanding of d/c instructions and has no further questions. Pt is stable, A&Ox4, VSS.  

## 2016-11-03 NOTE — ED Notes (Signed)
Patient transported to CT 

## 2016-11-03 NOTE — ED Provider Notes (Signed)
[facility]-EMERGENCY DEPT Provider Note   CSN: [id number] Arrival date & time: 11/03/16  0034  By signing my name below, I, Arianna Nassar, attest that this documentation has been prepared under the direction and in the presence of Gilda Creasehristopher J Pollina, MD.  Electronically Signed: Octavia HeirArianna Nassar, ED Scribe. 11/03/16. 1:54 AM.    History   Chief Complaint Chief Complaint  Patient presents with  . Abdominal Pain    " Food Poisoning"     The history is provided by the patient. No language interpreter was used.   HPI Comments: Anne Sims is a 60 y.o. female who has a PMhx of HTN presents to the Emergency Department complaining of gradual onset, moderate, generalized lower abdominal pain that started this evening. She reports associated nausea. Pt says her abdominal pain started a few hours after eating japanese hibachi food this evening. She has a past surgical hx of cholecystectomy, hysterectomy and gastric bypass with a gastric sleeve placed. No medication has been taken to alleviate her pain. Denies vomiting and diarrhea.  Past Medical History:  Diagnosis Date  . Anxiety   . Hypertension   . Obesity     Patient Active Problem List   Diagnosis Date Noted  . Primary insomnia 10/27/2016  . Obesity, Class II, BMI 35-39.9 11/07/2013  . Hidradenitis 09/21/2012  . S/P gastric bypass 08/16/2012  . Anxiety 08/16/2012    Past Surgical History:  Procedure Laterality Date  . ABDOMINAL HYSTERECTOMY    . CATARACT EXTRACTION    . CHOLECYSTECTOMY    . GASTRIC BYPASS      OB History    No data available       Home Medications    Prior to Admission medications   Medication Sig Start Date End Date Taking? Authorizing Provider  CALCIUM PO Take 1 tablet by mouth daily.   Yes Historical Provider, MD  cholecalciferol (VITAMIN D) 1000 units tablet Take 1,000 Units by mouth daily.   Yes Historical Provider, MD  citalopram (CELEXA) 40 MG tablet Take 1 tablet (40 mg total) by mouth  daily. 10/27/16  Yes Morrell RiddleSarah L Weber, PA-C  COCONUT OIL PO Take 1 tablet by mouth daily.   Yes Historical Provider, MD  Cyanocobalamin (VITAMIN B-12 PO) Take 1 tablet by mouth daily.   Yes Historical Provider, MD  Misc Natural Products (GLUCOSAMINE CHOND DOUBLE STR PO) Take 1 tablet by mouth daily.   Yes Historical Provider, MD  Multiple Vitamin (MULTIVITAMIN WITH MINERALS) TABS tablet Take 1 tablet by mouth daily.   Yes Historical Provider, MD  omega-3 acid ethyl esters (LOVAZA) 1 g capsule Take 1 g by mouth daily.   Yes Historical Provider, MD  traZODone (DESYREL) 50 MG tablet Take 0.5 tablets (25 mg total) by mouth at bedtime as needed for sleep. 10/27/16  Yes Morrell RiddleSarah L Weber, PA-C    Family History Family History  Problem Relation Age of Onset  . Cancer Father     esophageal  . Heart disease Father   . Hypertension Father   . High Cholesterol Father   . Heart disease Mother   . Healthy Sister     Social History Social History  Substance Use Topics  . Smoking status: Former Smoker    Years: 37.00    Quit date: 06/20/2004  . Smokeless tobacco: Never Used  . Alcohol use No     Allergies   Patient has no known allergies.   Review of Systems Review of Systems  Gastrointestinal: Positive for abdominal  pain and nausea. Negative for diarrhea and vomiting.  All other systems reviewed and are negative.    Physical Exam Updated Vital Signs BP 177/84   Pulse 75   Temp 97.4 F (36.3 C) (Oral)   Resp 16   LMP 01/16/2001   SpO2 95%   Physical Exam  Constitutional: She is oriented to person, place, and time. She appears well-developed and well-nourished. No distress.  HENT:  Head: Normocephalic and atraumatic.  Right Ear: Hearing normal.  Left Ear: Hearing normal.  Nose: Nose normal.  Mouth/Throat: Oropharynx is clear and moist and mucous membranes are normal.  Eyes: Conjunctivae and EOM are normal. Pupils are equal, round, and reactive to light.  Neck: Normal range of  motion. Neck supple.  Cardiovascular: Regular rhythm, S1 normal and S2 normal.  Exam reveals no gallop and no friction rub.   No murmur heard. Pulmonary/Chest: Effort normal and breath sounds normal. No respiratory distress. She exhibits no tenderness.  Abdominal: Soft. Normal appearance and bowel sounds are normal. There is no hepatosplenomegaly. There is tenderness. There is no rebound, no guarding, no tenderness at McBurney's point and negative Murphy's sign. No hernia.  Periumbilical tenderness  Musculoskeletal: Normal range of motion.  Neurological: She is alert and oriented to person, place, and time. She has normal strength. No cranial nerve deficit or sensory deficit. Coordination normal. GCS eye subscore is 4. GCS verbal subscore is 5. GCS motor subscore is 6.  Skin: Skin is warm, dry and intact. No rash noted. No cyanosis.  Psychiatric: She has a normal mood and affect. Her speech is normal and behavior is normal. Thought content normal.  Nursing note and vitals reviewed.    ED Treatments / Results  DIAGNOSTIC STUDIES: Oxygen Saturation is 99% on RA, normal by my interpretation.  COORDINATION OF CARE:  1:30 AM Discussed treatment plan with pt at bedside and pt agreed to plan.  Labs (all labs ordered are listed, but only abnormal results are displayed) Labs Reviewed  CBC WITH DIFFERENTIAL/PLATELET - Abnormal; Notable for the following:       Result Value   WBC 15.4 (*)    MCH 25.9 (*)    RDW 16.0 (*)    Neutro Abs 12.0 (*)    All other components within normal limits  COMPREHENSIVE METABOLIC PANEL - Abnormal; Notable for the following:    Chloride 99 (*)    Glucose, Bld 168 (*)    All other components within normal limits  URINALYSIS, ROUTINE W REFLEX MICROSCOPIC (NOT AT [facility]) - Abnormal; Notable for the following:    Ketones, ur 15 (*)    Leukocytes, UA SMALL (*)    All other components within normal limits  URINE MICROSCOPIC-ADD ON - Abnormal; Notable for the  following:    Squamous Epithelial / LPF 0-5 (*)    Bacteria, UA RARE (*)    All other components within normal limits  LIPASE, BLOOD    EKG  EKG Interpretation None       Radiology Ct Abdomen Pelvis W Contrast  Result Date: 11/03/2016 CLINICAL DATA:  Right lower quadrant pain, onset this evening. EXAM: CT ABDOMEN AND PELVIS WITH CONTRAST TECHNIQUE: Multidetector CT imaging of the abdomen and pelvis was performed using the standard protocol following bolus administration of intravenous contrast. CONTRAST:  ISOVUE-300 IOPAMIDOL (ISOVUE-300) INJECTION 61% COMPARISON:  None. FINDINGS: Lower chest: No acute abnormality. Hepatobiliary: Cholecystectomy. No focal liver lesion. There is mild diffuse fatty infiltration of the liver. Mild post cholecystectomy expansion of  the extrahepatic bile ducts. Pancreas: Unremarkable. No pancreatic ductal dilatation or surrounding inflammatory changes. Spleen: Normal in size without focal abnormality. Adrenals/Urinary Tract: Adrenal glands are unremarkable. Kidneys are normal, without renal calculi, focal lesion, or hydronephrosis. Bladder is unremarkable. Stomach/Bowel: Prior bariatric surgery with gastrojejunostomy. Distal small bowel is mildly distended with fecalization of content, but no caliber transition. Mild mesenteric edema adjacent to the mildly distended distal small bowel. No pneumatosis. No extraluminal air. Mild mural enhancement of the right hemicolon and distal small bowel. Colon is otherwise unremarkable. Vascular/Lymphatic: Mild fusiform enlargement of the infrarenal abdominal aortic aneurysm to a maximum caliber of 2.8 cm. Moderate atherosclerotic calcification of the aorta and iliac arteries. No pathologic adenopathy in the abdomen or pelvis. Reproductive: Hysterectomy.  No adnexal abnormalities. Other: No focal inflammatory changes in the abdomen or pelvis. No ascites. Musculoskeletal: No acute or significant osseous findings. IMPRESSION:  Mild distention of distal small bowel, and mild mural enhancement of distal small bowel and right hemicolon. No caliber transition to suggest obstruction point. This may represent enterocolitis. No pneumatosis or perforation. Electronically Signed   By: Ellery Plunkaniel R Mitchell M.D.   On: 11/03/2016 04:12    Procedures Procedures (including critical care time)  Medications Ordered in ED Medications  sodium chloride 0.9 % bolus 500 mL (500 mLs Intravenous New Bag/Given 11/03/16 0222)  ondansetron (ZOFRAN) injection 4 mg (4 mg Intravenous Given 11/03/16 0222)  morphine 4 MG/ML injection 4 mg (4 mg Intravenous Given 11/03/16 0222)  iopamidol (ISOVUE-300) 61 % injection (100 mLs  Contrast Given 11/03/16 0238)     Initial Impression / Assessment and Plan / ED Course  I have reviewed the triage vital signs and the nursing notes.  Pertinent labs & imaging results that were available during my care of the patient were reviewed by me and considered in my medical decision making (see chart for details).  Clinical Course    Patient presents to the emergency part for evaluation of abdominal pain. Patient reports onset of pain approximately 2 hours after eating [place] food. Patient indicates pain across the middle of her abdomen in the periumbilical region. Examination revealed tenderness without guarding or rebound. No obvious acute surgical process present on examination. She did have a leukocytosis, blood work otherwise unremarkable. Patient underwent CT scan because she has a history of gastric bypass surgery. No acute abnormality is noted at the surgical site, but she does have mild distention of distal small bowel and colon consistent with enterocolitis. Based on the patient's symptoms will need to be treated for infectious enterocolitis and follow-up with PCP and GI. Will treat with Cipro, Flagyl and analgesia.  I personally performed the services described in this documentation, which was scribed in my  presence. The recorded information has been reviewed and is accurate.   Final Clinical Impressions(s) / ED Diagnoses   Final diagnoses:  Enterocolitis    New Prescriptions New Prescriptions   No medications on file     Gilda Creasehristopher J Pollina, MD 11/03/16 [phone]

## 2016-11-09 ENCOUNTER — Ambulatory Visit (INDEPENDENT_AMBULATORY_CARE_PROVIDER_SITE_OTHER): Payer: BLUE CROSS/BLUE SHIELD | Admitting: Neurology

## 2016-11-09 ENCOUNTER — Encounter: Payer: Self-pay | Admitting: Neurology

## 2016-11-09 VITALS — BP 144/82 | HR 72 | Resp 20 | Ht 60.0 in | Wt 202.0 lb

## 2016-11-09 DIAGNOSIS — R0683 Snoring: Secondary | ICD-10-CM

## 2016-11-09 DIAGNOSIS — F5102 Adjustment insomnia: Secondary | ICD-10-CM

## 2016-11-09 NOTE — Progress Notes (Signed)
SLEEP MEDICINE CLINIC   Provider:  Melvyn Novasarmen  Geralynn Capri, M D  Referring Provider: Larkin InaWeber, Sarah L, PA-C Primary Care Physician:  Virgilio BellingWEBER,SARAH, PA-C  Chief Complaint  Patient presents with  . Follow-up    sleep study results    HPI:  Anne Sims is a 60 y.o. female , seen here as a referral  from [place] Benny LennertSarah Weber for a sleep consultation,  Chief complaint according to patient : " I cannot sleep "   Mrs. Anne Sims presents today with a long history of insomnia.  She describes an inability to fall asleep and stay asleep, this has been present for decades. Worsening is that her husband is a loud snorer which also keeps her from getting restful sleep. He however is not willing to undergo a sleep evaluation. She reports that her primary care physician has started her on trazodone which has helped and she has gotten more sleep recently than in a long time. In the past she had once tried Ambien but became asleep eater and sleep walker, she actually cooked in her sleep. Today she endorsed the Epworth Sleepiness Scale at 9 points and the fatigue severity scale at 37 points the geriatric depression scale it only 2 out of 15 points. She further endorsed memory loss, daytime sleepiness, weight gain, restless legs, snoring anxiety the feeling of not getting enough sleep. She also endorsed ringing in her ears and easy bruising.  She has a history of depression and anxiety but has been treated and she endorsed accordingly a low score on the geriatric depression score.  She was treated with Wellbutrin, Citalopram. Gained 50 pounds. She underwent gastric bypass surgery in 2011 leading to a weight loss of 114 pounds. Prior to gastric bypass surgery she had been diagnosed with obstructive sleep apnea by being very heavy and CPAP was prescribed. She discontinued the use of CPAP after the surgery and the resulting weight loss. Further surgery history includes a hysterectomy in 2002.   Sleep habits are as  follows: She sleeps in the same bedroom as her husband. She sleep on her side, sometimes supine. She sleeps in a fetal position. One pillow only is used , the bedroom is quiet , cool and dark.  She does not use a fan or humidifier in bedroom. She has not uses CPAP for the last 6 years about. She usually goes to bed at 9:30 PM, but it can take up to 2 hours to fall asleep. This has been helped by the use of trazodone. He also has restless legs. It comes back periodically sometimes it's not present for weeks. She wakes frequently up at 1:30 AM and again at 3:30 AM. It's not the urge to urinate that wakes her that she may use these breaks for a bathroom trip. She gets up in the morning at about 7:15 AM the latest she will wake up with 8 AM. She now feels refreshed in the morning after she has started taking trazodone. Before this was not the case and was not the case for years. Husband stated she snores.  Wife stated he snores- she has noted more memory lapses in morning hours.  She has amnesia for a recent trip to [place]   Sleep medical history and family sleep history:  Insomnia for decades.   Social history:  Married, independent Agricultural engineershop owner. 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   11-09-2016, Interval history the patient underwent a sleep study on 10/06/2016 which documented no significant clinical apnea. Even in supine the AHI  was 3.5, the overall AHI was 2.3 and not REM sleep accentuated the time below 89% oxygen saturation equals 0.0 minutes. She did have moderate limb movements but very few related arousals. She is surprised that she even has REM sleep, I was concerned that there could be REM related periodic limb movements, but she was quiet and still while in rem sleep. Based on her test I would refer her for snoring treatment is apnea is not present. The patient also does endorse the Epworth sleepiness score today at 8 points which is not elevated, the fatigue score is not elevated she has never been  told that she acts out dreams-  but periodically she sleep walks and sleep eats. She reports vivid dreams.    Her weight loss surgery has helped her apnea significantly.   Review of Systems: Out of a complete 14 system review, the patient complains of only the following symptoms, and all other reviewed systems are negative.  Epworth score 9 , Fatigue severity score 37  , depression score 2/15, Obesity , tinnitus, amnestic events, snoring.    Social History   Social History  . Marital status: Married    Spouse name: N/A  . Number of children: 1  . Years of education: Some college   Occupational History  . Owns own business     craft store   Social History Main Topics  . Smoking status: Former Smoker    Years: 37.00    Quit date: 06/20/2004  . Smokeless tobacco: Never Used  . Alcohol use No  . Drug use: No  . Sexual activity: Not Currently   Other Topics Concern  . 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 Cancer Father     esophageal  . Heart disease Father   . Hypertension Father   . High Cholesterol Father   . Heart disease Mother   . Healthy Sister     Past Medical History:  Diagnosis Date  . Anxiety   . Hypertension   . Obesity     Past Surgical History:  Procedure Laterality Date  . ABDOMINAL HYSTERECTOMY    . CATARACT EXTRACTION    . CHOLECYSTECTOMY    . GASTRIC BYPASS      Current Outpatient Prescriptions  Medication Sig Dispense Refill  . CALCIUM PO Take 1 tablet by mouth daily.    . cholecalciferol (VITAMIN D) 1000 units tablet Take 1,000 Units by mouth daily.    . ciprofloxacin (CIPRO) 500 MG tablet Take 1 tablet (500 mg total) by mouth 2 (two) times daily. 20 tablet 0  . citalopram (CELEXA) 40 MG tablet Take 1 tablet (40 mg total) by mouth daily. 90 tablet 1  . COCONUT OIL PO Take 1 tablet by mouth daily.    . Cyanocobalamin  (VITAMIN B-12 PO) Take 1 tablet by mouth daily.    . metroNIDAZOLE (FLAGYL) 500 MG tablet Take 1 tablet (500 mg total) by mouth 3 (three) times daily. 30 tablet 0  . Misc Natural Products (GLUCOSAMINE CHOND DOUBLE STR PO) Take 1 tablet by mouth daily.    . Multiple Vitamin (MULTIVITAMIN WITH MINERALS) TABS tablet Take 1 tablet by mouth daily.    Marland Kitchen omega-3 acid ethyl esters (LOVAZA) 1 g capsule Take 1 g by mouth daily.    . ondansetron (ZOFRAN) 4 MG tablet Take  1 tablet (4 mg total) by mouth every 6 (six) hours. 12 tablet 0  . traMADol (ULTRAM) 50 MG tablet Take 1 tablet (50 mg total) by mouth every 6 (six) hours as needed. 15 tablet 0  . traZODone (DESYREL) 50 MG tablet Take 0.5 tablets (25 mg total) by mouth at bedtime as needed for sleep. 45 tablet 1   No current facility-administered medications for this visit.     Allergies as of 11/09/2016  . (No Known Allergies)    Vitals: BP (!) 144/82   Pulse 72   Resp 20   Ht 5' (1.524 m)   Wt 202 lb (91.6 kg)   LMP 01/16/2001   BMI 39.45 kg/m  Last Weight:  Wt Readings from Last 1 Encounters:  11/09/16 202 lb (91.6 kg)   WUJ:WJXB mass index is 39.45 kg/m.     Last Height:   Ht Readings from Last 1 Encounters:  11/09/16 5' (1.524 m)    Physical exam:  General: The patient is awake, alert and appears not in acute distress. The patient is well groomed. Head: Normocephalic, atraumatic. Neck is supple. Mallampati 4,  neck circumference:16. Nasal airflow patent , TMJ is not evident . Retrognathia is seen. Full set of dentures.  Cardiovascular:  Regular rate and rhythm, without  murmurs or carotid bruit, and without distended neck veins. Respiratory: Lungs are clear to auscultation. Skin:  Without evidence of edema, or rash Trunk: BMI is elevated . The patient's posture is erect.  Neurologic exam : The patient is awake and alert, oriented to place and time.   Memory subjective described as intact. Amnestic events.  Memory testing  deferred.   MOCA:No flowsheet data found. MMSE:No flowsheet data found.  Attention span & concentration ability appears normal.  Speech is fluent,  without dysarthria, dysphonia or aphasia.  Mood and affect are appropriate.  Cranial nerves: Pupils are equal and briskly reactive to light. Funduscopic exam without evidence of pallor or edema.  Extraocular movements  in vertical and horizontal planes intact and without nystagmus. Visual fields by finger perimetry are intact. Hearing to finger rub intact. Facial sensation intact to fine touch. Facial motor strength is symmetric and tongue and uvula move midline. Shoulder shrug was symmetrical.  Motor exam:  Normal tone, muscle bulk and symmetric strength in all extremities.Sensory:  Fine touch, pinprick and vibration were tested in all extremities. Proprioception tested in the upper extremities was normal. Coordination: Rapid alternating movements in the fingers/hands was normal. Finger-to-nose maneuver normal without evidence of ataxia, dysmetria or tremor. Gait and station: Patient walks without assistive device and is able unassisted to climb up to the exam table. Strength within normal limits.Stance is stable and normal.   Deep tendon reflexes: in the upper and lower extremities are symmetric and intact.  The patient was advised of the nature of the diagnosed sleep disorder , the treatment options and risks for general a health and wellness arising from not treating the condition.  I spent more than 45 minutes of face to face time with the patient. Greater than 50% of time was spent in counseling and coordination of care. We have discussed the diagnosis and differential and I answered the patient's questions.    I also obtained a restless leg question here, the [facility] restless leg syndrome quality of life questionnaire; she endorsed only mild impairment,  no impairment over the last 4 weeks.   Assessment:  After physical and neurologic  examination, review of laboratory studies,  Personal  review of imaging studies, reports of other /same  Imaging studies ,  Results of polysomnography/ neurophysiology testing and pre-existing records as far as provided in visit., my assessment is   1) Snoring, but not Apnea .  2) Depression has been treated .   3) insomnia on Trazodone .   4) PLMs but very few  arousals.   Plan:  Treatment plan and additional workup : Referral to dentistry for snoring treatment, Dr Myrtis SerKatz and Dr Irene LimboSandra Fuller are options, before the end of the year. .  Deferred RLS  Work up and treatment as symptoms are absent for about 4 weeks.  Stay on Trazodone.    Porfirio Mylararmen Caroll Weinheimer MD  11/09/2016   CC: Morrell RiddleSarah L Weber, Pa-c [address] [place], [place] [id number]

## 2016-11-27 ENCOUNTER — Encounter: Payer: Self-pay | Admitting: Physician Assistant

## 2016-11-27 DIAGNOSIS — F419 Anxiety disorder, unspecified: Secondary | ICD-10-CM

## 2016-11-27 DIAGNOSIS — F5101 Primary insomnia: Secondary | ICD-10-CM

## 2016-12-01 MED ORDER — CITALOPRAM HYDROBROMIDE 40 MG PO TABS: 40.0000 mg | ORAL_TABLET | Freq: Every day | ORAL | 3 refills | Status: AC

## 2016-12-01 MED ORDER — TRAZODONE HCL 50 MG PO TABS: 25.0000 mg | ORAL_TABLET | Freq: Every evening | ORAL | 3 refills | Status: AC | PRN

## 2016-12-01 NOTE — Telephone Encounter (Signed)
Done

## 2017-05-07 IMAGING — CT CT ABD-PELV W/ CM
2 of 5 series · 16 of 46 positions shown, 18 images · IV contrast (Omni 300)
Comparison: None.

CLINICAL DATA: Right lower quadrant pain, onset this evening.

EXAM:
CT ABDOMEN AND PELVIS WITH CONTRAST
TECHNIQUE: Multidetector CT imaging of the abdomen and pelvis was performed
using the standard protocol following bolus administration of
intravenous contrast.
CONTRAST:  100mL [id number] IOPAMIDOL ([id number]) INJECTION 61%

[Series 4: a/p w/ 5mm · axial · 0.64mm/px · z∈[+1012,+1412]mm · 13 of 90 slices shown, 15 images]
[im 5/90  soft-tissue]
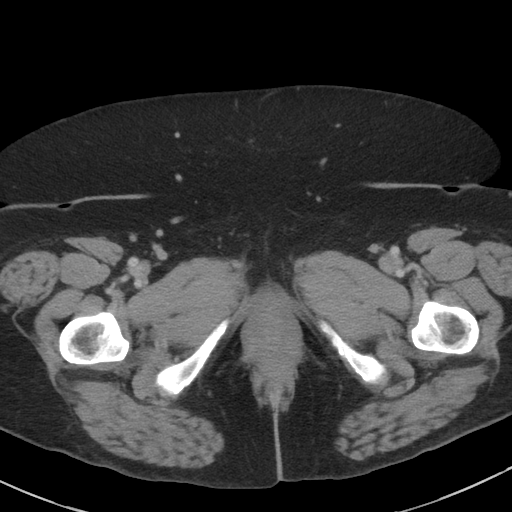
[im 5/90  bone]
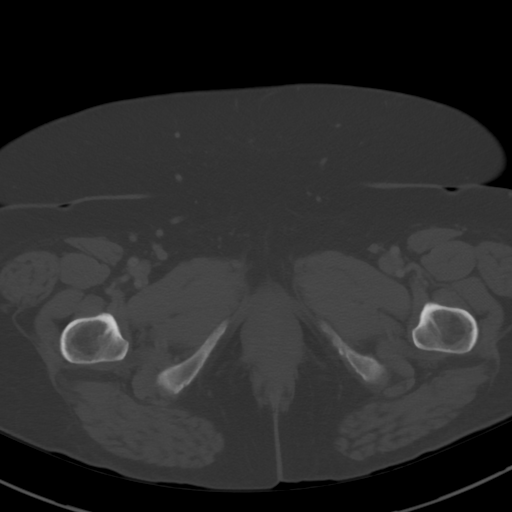
[im 15/90  soft-tissue]
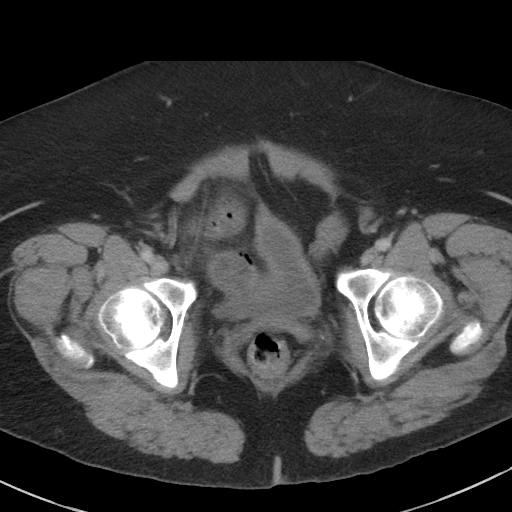
[im 19/90  soft-tissue]
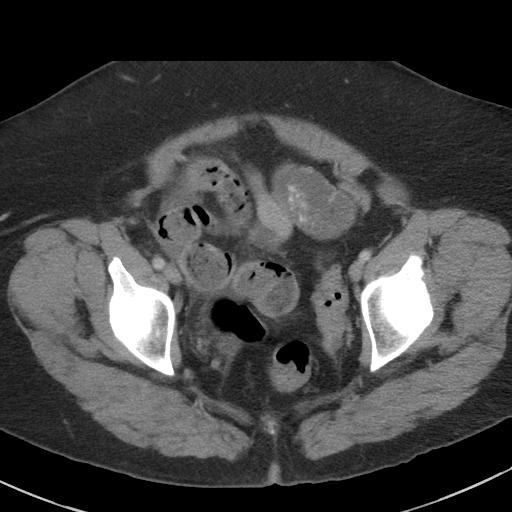
[im 24/90  soft-tissue]
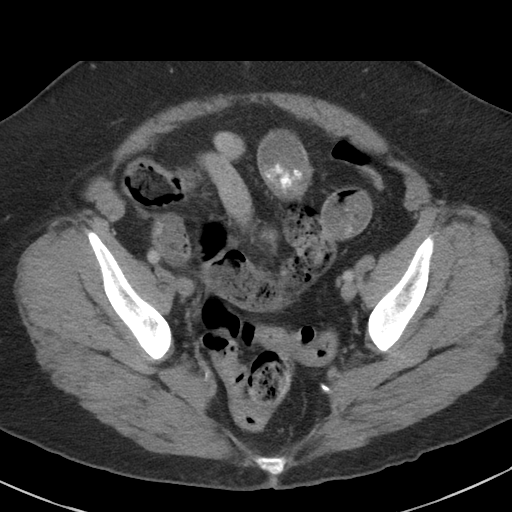
[im 33/90  soft-tissue]
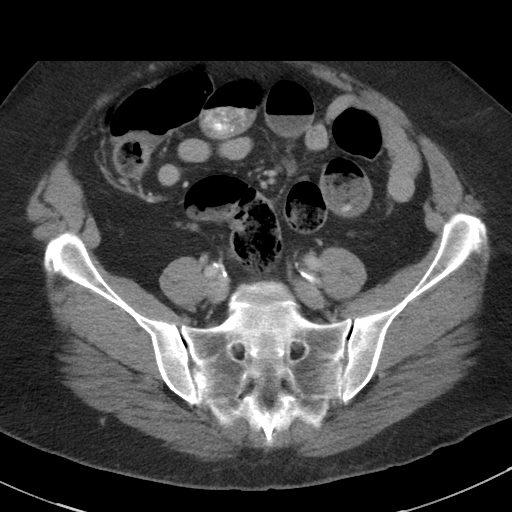
[im 38/90  soft-tissue]
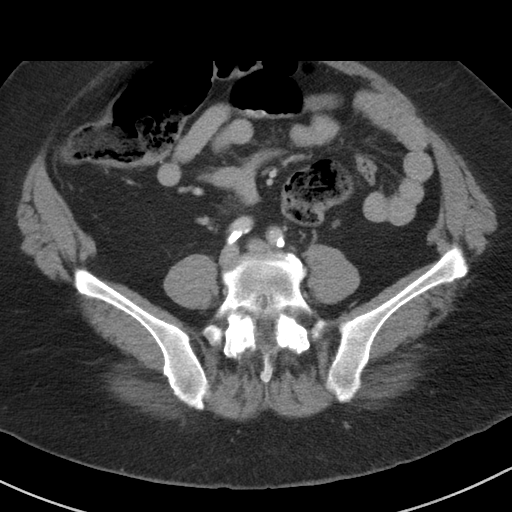
[im 47/90  soft-tissue]
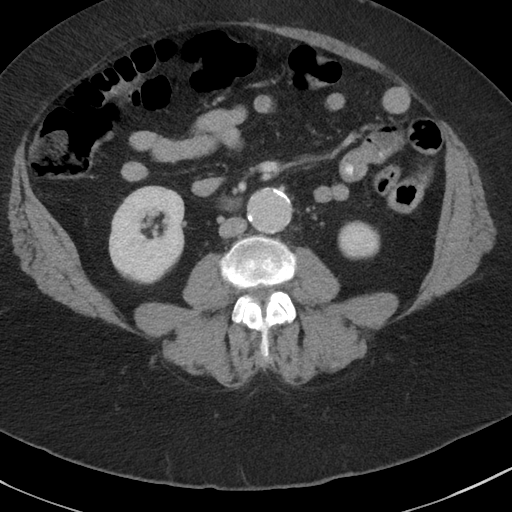
[im 52/90  soft-tissue]
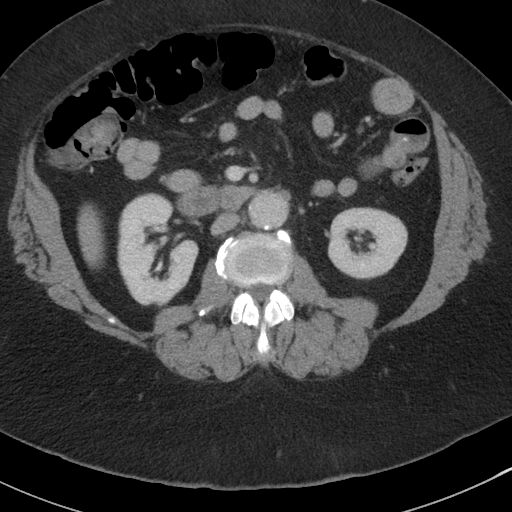
[im 57/90  soft-tissue]
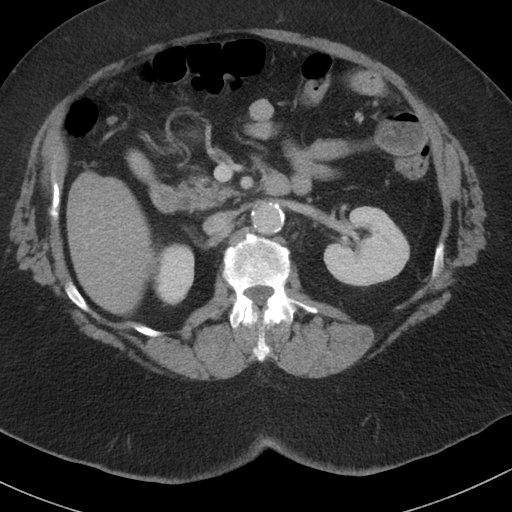
[im 57/90  bone]
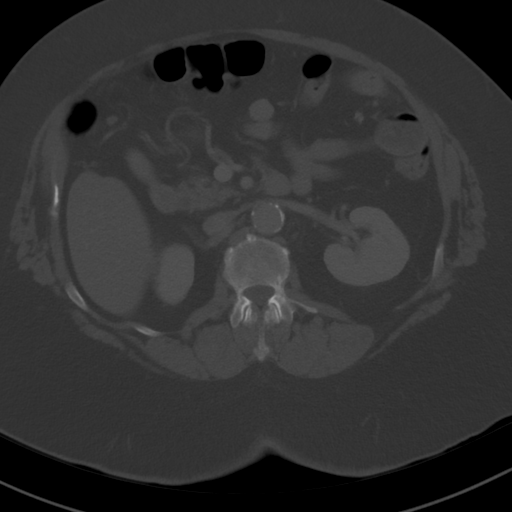
[im 66/90  soft-tissue]
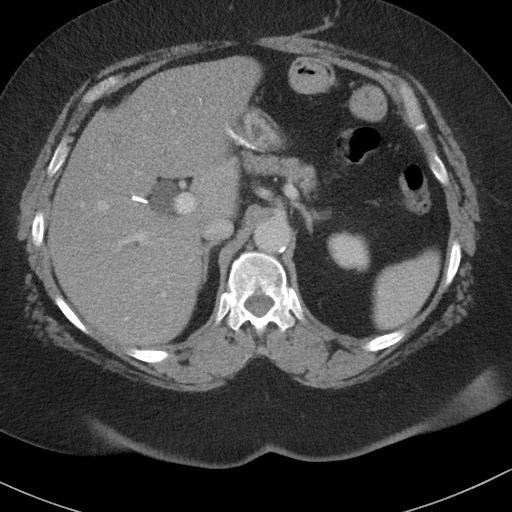
[im 71/90  soft-tissue]
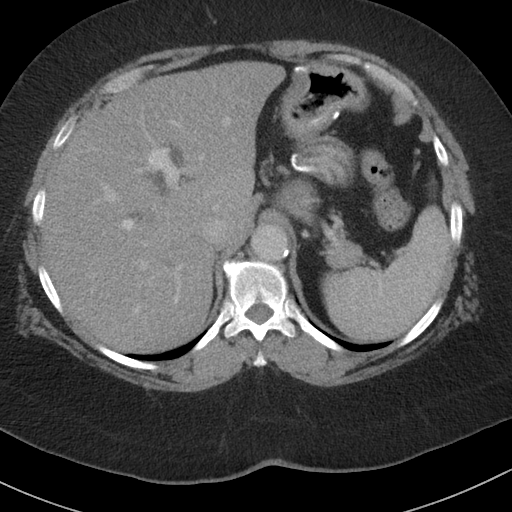
[im 75/90  soft-tissue]
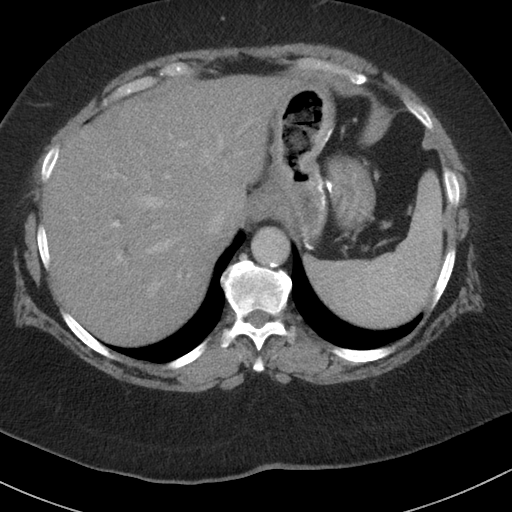
[im 85/90  soft-tissue]
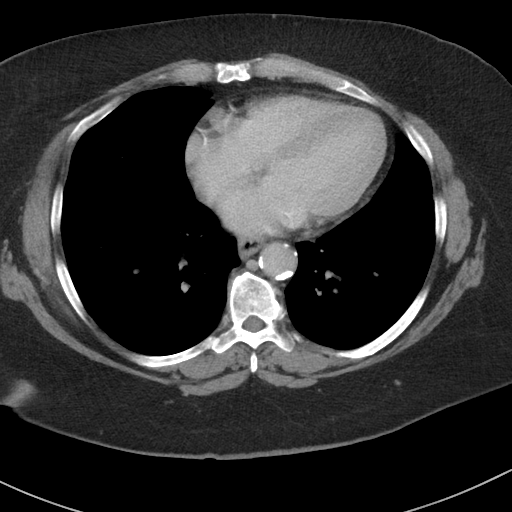

[Series 7: a/p w/ cor · coronal · 0.77mm/px · 3 of 151 slices shown]
[im 51/151  soft-tissue]
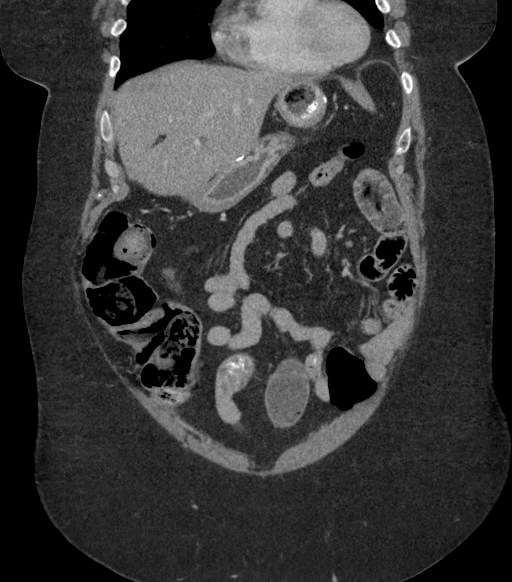
[im 67/151  soft-tissue]
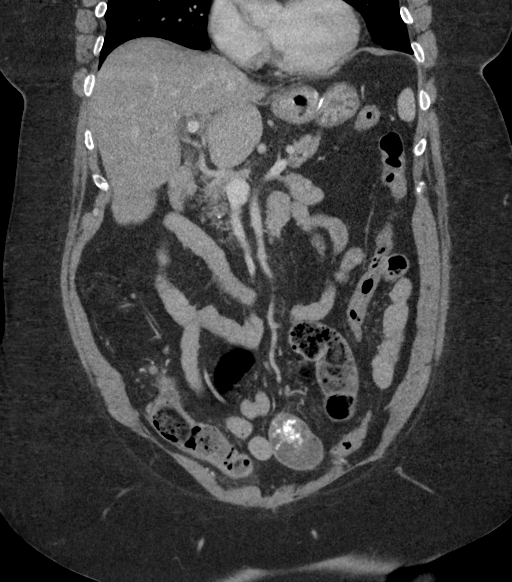
[im 84/151  soft-tissue]
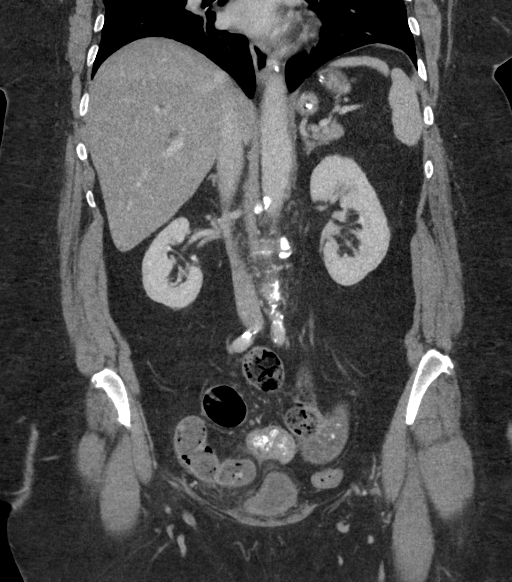

[16 of 46 positions shown; findings below may reference images not displayed]

FINDINGS: Lower chest: No acute abnormality.

Hepatobiliary: Cholecystectomy. No focal liver lesion. There is mild
diffuse fatty infiltration of the liver. Mild post cholecystectomy
expansion of the extrahepatic bile ducts.

Pancreas: Unremarkable. No pancreatic ductal dilatation or
surrounding inflammatory changes.

Spleen: Normal in size without focal abnormality.

Adrenals/Urinary Tract: Adrenal glands are unremarkable. Kidneys are
normal, without renal calculi, focal lesion, or hydronephrosis.
Bladder is unremarkable.

Stomach/Bowel: Prior bariatric surgery with gastrojejunostomy.
Distal small bowel is mildly distended with fecalization of content,
but no caliber transition. Mild mesenteric edema adjacent to the
mildly distended distal small bowel. No pneumatosis. No extraluminal
air. Mild mural enhancement of the right hemicolon and distal small
bowel. Colon is otherwise unremarkable.

Vascular/Lymphatic: Mild fusiform enlargement of the infrarenal
abdominal aortic aneurysm to a maximum caliber of 2.8 cm. Moderate
atherosclerotic calcification of the aorta and iliac arteries. No
pathologic adenopathy in the abdomen or pelvis.

Reproductive: Hysterectomy.  No adnexal abnormalities.

Other: No focal inflammatory changes in the abdomen or pelvis. No
ascites.

Musculoskeletal: No acute or significant osseous findings.
IMPRESSION: Mild distention of distal small bowel, and mild mural enhancement of
distal small bowel and right hemicolon. No caliber transition to
suggest obstruction point. This may represent enterocolitis. No
pneumatosis or perforation.
